# Patient Record
Sex: Male | Born: 1940 | Race: White | Hispanic: No
Health system: Southern US, Community
[De-identification: ages and names within clinical notes are randomized; demographics above are authoritative.]

## PROBLEM LIST (undated history)

## (undated) DIAGNOSIS — I739 Peripheral vascular disease, unspecified: Secondary | ICD-10-CM

## (undated) DIAGNOSIS — G2581 Restless legs syndrome: Secondary | ICD-10-CM

## (undated) DIAGNOSIS — C801 Malignant (primary) neoplasm, unspecified: Secondary | ICD-10-CM

## (undated) DIAGNOSIS — C679 Malignant neoplasm of bladder, unspecified: Secondary | ICD-10-CM

## (undated) DIAGNOSIS — R2689 Other abnormalities of gait and mobility: Secondary | ICD-10-CM

## (undated) DIAGNOSIS — J449 Chronic obstructive pulmonary disease, unspecified: Secondary | ICD-10-CM

## (undated) DIAGNOSIS — M1990 Unspecified osteoarthritis, unspecified site: Secondary | ICD-10-CM

## (undated) DIAGNOSIS — E785 Hyperlipidemia, unspecified: Secondary | ICD-10-CM

## (undated) DIAGNOSIS — C439 Malignant melanoma of skin, unspecified: Secondary | ICD-10-CM

## (undated) DIAGNOSIS — I8390 Asymptomatic varicose veins of unspecified lower extremity: Secondary | ICD-10-CM

## (undated) DIAGNOSIS — E7800 Pure hypercholesterolemia, unspecified: Secondary | ICD-10-CM

## (undated) DIAGNOSIS — H9190 Unspecified hearing loss, unspecified ear: Secondary | ICD-10-CM

## (undated) DIAGNOSIS — G4700 Insomnia, unspecified: Secondary | ICD-10-CM

## (undated) DIAGNOSIS — N189 Chronic kidney disease, unspecified: Secondary | ICD-10-CM

## (undated) DIAGNOSIS — I639 Cerebral infarction, unspecified: Secondary | ICD-10-CM

## (undated) DIAGNOSIS — D649 Anemia, unspecified: Secondary | ICD-10-CM

## (undated) DIAGNOSIS — K219 Gastro-esophageal reflux disease without esophagitis: Secondary | ICD-10-CM

## (undated) DIAGNOSIS — G459 Transient cerebral ischemic attack, unspecified: Secondary | ICD-10-CM

## (undated) DIAGNOSIS — I2510 Atherosclerotic heart disease of native coronary artery without angina pectoris: Secondary | ICD-10-CM

## (undated) DIAGNOSIS — C649 Malignant neoplasm of unspecified kidney, except renal pelvis: Secondary | ICD-10-CM

## (undated) DIAGNOSIS — I719 Aortic aneurysm of unspecified site, without rupture: Secondary | ICD-10-CM

## (undated) DIAGNOSIS — M170 Bilateral primary osteoarthritis of knee: Secondary | ICD-10-CM

## (undated) DIAGNOSIS — R609 Edema, unspecified: Secondary | ICD-10-CM

## (undated) DIAGNOSIS — M109 Gout, unspecified: Secondary | ICD-10-CM

## (undated) DIAGNOSIS — Q788 Other specified osteochondrodysplasias: Secondary | ICD-10-CM

## (undated) DIAGNOSIS — Z955 Presence of coronary angioplasty implant and graft: Secondary | ICD-10-CM

## (undated) DIAGNOSIS — I10 Essential (primary) hypertension: Secondary | ICD-10-CM

## (undated) DIAGNOSIS — M25551 Pain in right hip: Secondary | ICD-10-CM

## (undated) HISTORY — DX: Restless legs syndrome: G25.81

## (undated) HISTORY — DX: Cerebral infarction, unspecified: I63.9

## (undated) HISTORY — DX: Unspecified hearing loss, unspecified ear: H91.90

## (undated) HISTORY — DX: Bilateral primary osteoarthritis of knee: M17.0

## (undated) HISTORY — PX: CAROTID ENDARTERECTOMY: SUR193

## (undated) HISTORY — DX: Asymptomatic varicose veins of unspecified lower extremity: I83.90

## (undated) HISTORY — DX: Pain in right hip: M25.551

## (undated) HISTORY — DX: Malignant neoplasm of unspecified kidney, except renal pelvis: C64.9

## (undated) HISTORY — DX: Unspecified osteoarthritis, unspecified site: M19.90

## (undated) HISTORY — PX: KIDNEY SURGERY: SHX687

## (undated) HISTORY — DX: Other specified osteochondrodysplasias: Q78.8

## (undated) HISTORY — DX: Peripheral vascular disease, unspecified: I73.9

## (undated) HISTORY — PX: BLADDER SURGERY: SHX569

## (undated) HISTORY — PX: CORONARY ANGIOPLASTY WITH STENT PLACEMENT: SHX49

## (undated) HISTORY — PX: ABDOMINAL AORTIC ANEURYSM REPAIR: SUR1152

## (undated) HISTORY — DX: Presence of coronary angioplasty implant and graft: Z95.5

## (undated) HISTORY — PX: TONSILLECTOMY: SUR1361

## (undated) HISTORY — DX: Other abnormalities of gait and mobility: R26.89

## (undated) HISTORY — DX: Malignant (primary) neoplasm, unspecified: C80.1

## (undated) HISTORY — DX: Malignant neoplasm of bladder, unspecified: C67.9

## (undated) HISTORY — DX: Insomnia, unspecified: G47.00

## (undated) HISTORY — DX: Malignant melanoma of skin, unspecified: C43.9

## (undated) HISTORY — PX: JOINT REPLACEMENT: SHX530

## (undated) HISTORY — DX: Gout, unspecified: M10.9

## (undated) HISTORY — DX: Essential (primary) hypertension: I10

## (undated) HISTORY — DX: Pure hypercholesterolemia, unspecified: E78.00

## (undated) HISTORY — DX: Transient cerebral ischemic attack, unspecified: G45.9

## (undated) HISTORY — PX: NEPHRECTOMY: SHX65

## (undated) HISTORY — DX: Hyperlipidemia, unspecified: E78.5

## (undated) HISTORY — DX: Aortic aneurysm of unspecified site, without rupture: I71.9

## (undated) HISTORY — PX: ARTERIAL THROMBECTOMY: SHX558

## (undated) HISTORY — DX: Anemia, unspecified: D64.9

## (undated) HISTORY — PX: OTHER SURGICAL HISTORY: SHX169

## (undated) HISTORY — DX: Chronic kidney disease, unspecified: N18.9

## (undated) HISTORY — DX: Atherosclerotic heart disease of native coronary artery without angina pectoris: I25.10

## (undated) HISTORY — DX: Chronic obstructive pulmonary disease, unspecified: J44.9

## (undated) HISTORY — DX: Edema, unspecified: R60.9

## (undated) HISTORY — DX: Gastro-esophageal reflux disease without esophagitis: K21.9

---

## 2012-11-07 HISTORY — PX: OTHER SURGICAL HISTORY: SHX169

## 2012-11-14 ENCOUNTER — Non-Acute Institutional Stay (SKILLED_NURSING_FACILITY): Payer: Medicare Other | Admitting: Adult Health

## 2012-11-14 ENCOUNTER — Encounter: Payer: Self-pay | Admitting: Adult Health

## 2012-11-14 DIAGNOSIS — E785 Hyperlipidemia, unspecified: Secondary | ICD-10-CM | POA: Insufficient documentation

## 2012-11-14 DIAGNOSIS — K219 Gastro-esophageal reflux disease without esophagitis: Secondary | ICD-10-CM | POA: Insufficient documentation

## 2012-11-14 DIAGNOSIS — M1710 Unilateral primary osteoarthritis, unspecified knee: Secondary | ICD-10-CM

## 2012-11-14 DIAGNOSIS — M1712 Unilateral primary osteoarthritis, left knee: Secondary | ICD-10-CM

## 2012-11-14 DIAGNOSIS — R609 Edema, unspecified: Secondary | ICD-10-CM

## 2012-11-14 DIAGNOSIS — I10 Essential (primary) hypertension: Secondary | ICD-10-CM

## 2012-11-14 DIAGNOSIS — G2581 Restless legs syndrome: Secondary | ICD-10-CM | POA: Insufficient documentation

## 2012-11-14 NOTE — Assessment & Plan Note (Signed)
Will continue prilosec 20 mg daily  

## 2012-11-14 NOTE — Assessment & Plan Note (Signed)
Will continue coreg 6.25 mg twice daily and will monitor

## 2012-11-14 NOTE — Assessment & Plan Note (Signed)
Is stable will continue requip 2 mg nightly and will monitor

## 2012-11-14 NOTE — Assessment & Plan Note (Signed)
Will continue lasix 20 mg twice daily and will monitor

## 2012-11-14 NOTE — Progress Notes (Signed)
Patient ID: Zachary Sloan, male   DOB: [birth year], 72 y.o.   MRN: [id number]  [facility]  Allergies  Allergen Reactions  . Ambien (Zolpidem Tartrate)   . Norvasc (Amlodipine Besylate)      Chief Complaint  Patient presents with  . Hospitalization Follow-up    HPI:  Osteoarthritis of left knee Is status post left knee replacement the left knee is hot; red; inflamed swollen with the erythema extending to mid calf; will continue doxycycline; will have him seen at dr. Paul Sloan office and in the am and will continue to monitor his status   Restless leg syndrome Is stable will continue requip 2 mg nightly and will monitor  Other and unspecified hyperlipidemia Will continue lipitor 80 mg daily   Essential hypertension, benign Will continue coreg 6.25 mg twice daily and will monitor  Edema Will continue lasix 20 mg twice daily and will monitor   GERD (gastroesophageal reflux disease) Will continue prilosec 20 mg daily    Past Medical History  Diagnosis Date  . Arthritis   . GERD (gastroesophageal reflux disease)   . Hyperlipidemia   . COPD (chronic obstructive pulmonary disease)   . CAD (coronary artery disease)   . Transient ischemic attack   . Renal cancer   . Bladder cancer   . Melanoma   . Anemia     Past Surgical History  Procedure Laterality Date  . Left total knee arthroplasty  [id number]    VITAL SIGNS BP 132/75  Pulse 96  Ht 5\' 11"  (1.803 m)  Wt 148 lb (67.132 kg)  BMI 20.65 kg/m2   Patient's Medications  New Prescriptions   No medications on file  Previous Medications   ATORVASTATIN (LIPITOR) 80 MG TABLET    Take 80 mg by mouth daily.   CARVEDILOL (COREG) 6.25 MG TABLET    Take 6.25 mg by mouth 2 (two) times daily with a meal.   CHOLECALCIFEROL (VITAMIN D) 1000 UNITS TABLET    Take 2,000 Units by mouth daily.   DIPHENHYDRAMINE (SOMINEX) 25 MG TABLET    Take 50 mg by mouth at bedtime as needed for sleep.   DOXYCYCLINE (VIBRAMYCIN) 100 MG CAPSULE     Take 100 mg by mouth 2 (two) times daily.   FUROSEMIDE (LASIX) 20 MG TABLET    Take 20 mg by mouth 2 (two) times daily.   OMEPRAZOLE (PRILOSEC) 20 MG CAPSULE    Take 20 mg by mouth daily.   OXYCODONE-ACETAMINOPHEN (PERCOCET/ROXICET) 5-325 MG PER TABLET    Take 1-2 tablets by mouth every 6 (six) hours as needed for pain.   ROPINIROLE (REQUIP) 2 MG TABLET    Take 2 mg by mouth at bedtime.   VALACYCLOVIR (VALTREX) 1000 MG TABLET    Take 1,000 mg by mouth daily as needed.   WARFARIN (COUMADIN) 2.5 MG TABLET    Take 2.5 mg by mouth daily.  Modified Medications   No medications on file  Discontinued Medications   No medications on file    SIGNIFICANT DIAGNOSTIC EXAMS  11-10-12: chest x-ray: no acute disease  LABS REVIEWED:   11-10-12:wbc 13.2; hgb 10.3; hct 31.3; mcv 94.3.; plt 207 glucose 115; bun 22; creat 1.54; k+ 3.6;  na++ 141   Review of Systems  Constitutional: Negative for fever and malaise/fatigue.  Respiratory: Negative for cough and shortness of breath.   Cardiovascular: Negative for chest pain and leg swelling.  Gastrointestinal: Negative for heartburn, abdominal pain and constipation.  Musculoskeletal: Negative for myalgias and joint  pain.  Skin:       Left knee is red hot swollen and painful  Neurological: Negative for headaches.  Psychiatric/Behavioral: Negative for depression. The patient does not have insomnia.      Physical Exam  Constitutional: He is oriented to person, place, and time. He appears well-developed and well-nourished.  thin  Neck: Neck supple. No thyromegaly present.  Cardiovascular: Normal rate, regular rhythm and intact distal pulses.   Respiratory: Effort normal and breath sounds normal. No respiratory distress.  GI: Soft. Bowel sounds are normal. He exhibits no distension. There is no tenderness.  Musculoskeletal: He exhibits no edema.  Has limited range of motion left knee due to recent joint replacement  Neurological: He is alert and  oriented to person, place, and time.  Skin: Skin is warm and dry.  Left knee erythremic; hot; swollen; tender to touch; with redness extending to mid calf.    Psychiatric: He has a normal mood and affect.      ASSESSMENT/ PLAN:  Osteoarthritis of left knee Is status post left knee replacement the left knee is hot; red; inflamed swollen with the erythema extending to mid calf; will continue doxycycline; will have him seen at dr. Paul Sloan office and in the am and will continue to monitor his status   Restless leg syndrome Is stable will continue requip 2 mg nightly and will monitor  Other and unspecified hyperlipidemia Will continue lipitor 80 mg daily   Essential hypertension, benign Will continue coreg 6.25 mg twice daily and will monitor  Edema Will continue lasix 20 mg twice daily and will monitor   GERD (gastroesophageal reflux disease) Will continue prilosec 20 mg daily    Will get stat cbc; inr in the am.   Time spent with patient 50 minutes

## 2012-11-14 NOTE — Assessment & Plan Note (Signed)
Is status post left knee replacement the left knee is hot; red; inflamed swollen with the erythema extending to mid calf; will continue doxycycline; will have him seen at dr. [name] office and in the am and will continue to monitor his status

## 2012-11-14 NOTE — Assessment & Plan Note (Signed)
Will continue lipitor 80 mg daily  

## 2012-11-15 ENCOUNTER — Other Ambulatory Visit: Payer: Self-pay | Admitting: *Deleted

## 2012-11-15 ENCOUNTER — Non-Acute Institutional Stay (SKILLED_NURSING_FACILITY): Payer: Medicare Other | Admitting: Internal Medicine

## 2012-11-15 DIAGNOSIS — M1712 Unilateral primary osteoarthritis, left knee: Secondary | ICD-10-CM

## 2012-11-15 DIAGNOSIS — M1710 Unilateral primary osteoarthritis, unspecified knee: Secondary | ICD-10-CM

## 2012-11-15 DIAGNOSIS — I80299 Phlebitis and thrombophlebitis of other deep vessels of unspecified lower extremity: Secondary | ICD-10-CM

## 2012-11-15 DIAGNOSIS — J449 Chronic obstructive pulmonary disease, unspecified: Secondary | ICD-10-CM

## 2012-11-15 DIAGNOSIS — D62 Acute posthemorrhagic anemia: Secondary | ICD-10-CM

## 2012-11-15 MED ORDER — OXYCODONE-ACETAMINOPHEN 5-325 MG PO TABS: ORAL_TABLET | ORAL | Status: DC

## 2012-11-22 ENCOUNTER — Non-Acute Institutional Stay (SKILLED_NURSING_FACILITY): Payer: Medicare Other | Admitting: Internal Medicine

## 2012-11-22 DIAGNOSIS — I82402 Acute embolism and thrombosis of unspecified deep veins of left lower extremity: Secondary | ICD-10-CM

## 2012-11-22 DIAGNOSIS — I82409 Acute embolism and thrombosis of unspecified deep veins of unspecified lower extremity: Secondary | ICD-10-CM

## 2012-11-22 NOTE — Progress Notes (Signed)
Patient ID: Zachary Sloan, male   DOB: 10-02-1940, 72 y.o.   MRN: [id number] Facility; [facility]. Chief complaint predischarge review. History; this is a patient who underwent a elective total knee replacement on the left. He was sent here for rehabilitation on Coumadin for DVT prophylaxis. He is rehabilitating well and is now ready to go home. A week ago. He was noted to have left leg swelling and had a duplex ultrasound of the left leg showing an acute DVT in the left posterior tibial vein and soleal vein. His INR at that time was in the 1.9-2 range. He has no history of DVT. He was seen by our service on 6/24 and sent to interventional radiology for consideration of a Greenfield filter, although I am not certain that was done, he did not have a Greenfield filter placed. His INR yesterday was 2.2. He is due to go home today. Prior to the hospitalization. He tells me was on plavix and asa 81 since 2002 due to 2 TIA's he suffered at that time. He did not receive lovenox last week. He is currently on coumadin 3 mg  ROS: Genral: tells me he has been running low grade temps Respiratory; No sob, chest pain CVS; No exertional chest pain MSK; Able to ambulate with a cane.  Physical exam.  General: appears well REsp: clear a/e bilaterally no crackles or wheezing CVS: HS normal no S4 no murmers.  EST: Still swelling of Left leg, erythema of the left knee seems to have receeded vs previous markings.   Impressions/plan #1 DVT left leg. His INR is 1.9 which seems to be the level at which the DVT formed in the first place. I think he should have received lovenox last week until his INR was in the 2.5-3 range. I am  Concerned about him going home at this poiint and will discuss this with the patient #2 On plavix and TIA for a history of a TIA (?2) in 2002, this need to be put on hold indefinately until seen by his primary MD Or cardiologist.   After further discussion we agree that we should bridge  lovenox to coumadin. He  Lives alone. I am not comfortable with the home health option as it applies to this type of intervention. I will also check a CBC. If his inr is up on firday to 2.5-3.0 i will dicharge him on coumadin.

## 2012-11-25 ENCOUNTER — Non-Acute Institutional Stay (SKILLED_NURSING_FACILITY): Payer: Medicare Other | Admitting: Internal Medicine

## 2012-11-25 DIAGNOSIS — R609 Edema, unspecified: Secondary | ICD-10-CM

## 2012-11-25 DIAGNOSIS — I82409 Acute embolism and thrombosis of unspecified deep veins of unspecified lower extremity: Secondary | ICD-10-CM

## 2012-11-25 DIAGNOSIS — I82402 Acute embolism and thrombosis of unspecified deep veins of left lower extremity: Secondary | ICD-10-CM

## 2012-11-25 DIAGNOSIS — M109 Gout, unspecified: Secondary | ICD-10-CM

## 2012-11-25 NOTE — Progress Notes (Signed)
Patient ID: Zachary Sloan, male   DOB: [birth year], 72 y.o.   MRN: [id number]              Patient ID: Zachary Sloan, male   DOB: [birth year], 72 y.o.   MRN: [id number] Facility; [facility]. Chief complaint predischarge review. History; this is a patient who underwent a elective total knee replacement on the left. I saw him 3 days ago and placed his discharge on hold due to inadequte anticoagulation for an acute DVT of his left leg. I have bridged him with full dose lovenox while increasing his coumadin.  He was sent here for rehabilitation on Coumadin for DVT prophylaxis however he will now require a full course of coumadin for ?3 -4 months. A week ago. He was noted to have left leg swelling and had a duplex ultrasound of the left leg showing an acute DVT in the left posterior tibial vein and soleal vein. His INR at that time was in the 1.9-2 range. He has no history of DVT. He was seen by our service on 6/24 and sent to interventional radiology for consideration of a Greenfield filter. This was fortunately not done. I have treated him with full dose lovenox. On 3mg  of coumadin on 7/1 his INR was 1.9. On 5mg  yesterday it was2.4, today on 5mg  2.6. I will discharge him on 4mg .   ROS: General: feels well no fever. Respiratory; No sob, chest pain, no pleuritic chest pain. CVS; No exertional chest pain MSK; Able to ambulate with a cane. Pain is quite tolerable. Swelling in his leg is much better. Has developed acute pain in the lefft first MTP. Had an attack of gout several years ago.  Physical exam.   General: appears well REsp: clear a/e bilaterally no crackles or wheezing CVS: HS normal no S4, soft benign sounding SEM. No chf.    ExT: Swelling of the left leg is much imporved. Still some minor erythema of his left knee laterally however I don't believe this is indicative of an infection.  MSK; Left leg is much better in terms of swelling. Minimal erythema of his knee. Acute arthritis in his first MTP  on the left which is probably gout.   Impressions/plan #1 DVT left leg. His INR is 2.6 on 5mg . Lovenox will stop. I will give him 5mg  x1 today then discharge on 4mg . F/u with his primary md in 1wk #2 On plavix and TIA for a history of a TIA (?2) in 2002, this need to be put on hold indefinately until seen by his primary MD Or cardiologist. I am not prepated to give these in addition to coumadin.  3)S/p left totatl knee replacement. Doing well, home health Pt/OT/SN. Has a walker and cane.  4)Acute gout: Rx with colchicine. In full dose today. Will see if we have in house.  5)CRf this is stable 6)anemia presumable post op. Will dischage on FE  >30 min spent.

## 2012-12-11 NOTE — Progress Notes (Signed)
Patient ID: Zachary Sloan, male   DOB: November 01, 1940, 72 y.o.   MRN: [id number]        HISTORY & PHYSICAL  DATE: 11/15/2012   FACILITY: [facility]  LEVEL OF CARE: SNF (31)  ALLERGIES:  Allergies  Allergen Reactions  . Ambien (Zolpidem Tartrate)   . Norvasc (Amlodipine Besylate)     CHIEF COMPLAINT:  Manage left knee osteoarthritis, acute blood loss anemia, and COPD.  HISTORY OF PRESENT ILLNESS:  The patient is a 72 year-old, Caucasian male.    KNEE OSTEOARTHRITIS: Patient had a history of pain and functional disability in the knee due to end-stage osteoarthritis and has failed nonsurgical conservative treatments. Patient had worsening of pain with activity and weight bearing, pain that interfered with activities of daily living & pain with passive range of motion. Therefore patient underwent total knee arthroplasty and tolerated the procedure well. Patient is admitted to this facility for sort short-term rehabilitation. The patient is having increased left lower extremity swelling and pain.  A doppler was done by Orthopedic Surgery which showed a positive DVT.  He denies chest pain, shortness of breath.    ANEMIA: Postoperatively, patient suffered acute blood loss.  The anemia has been stable. The patient denies fatigue, melena or hematochezia. No complications from the medications currently being used.  Last hemoglobin level:  11.8.  COPD: the COPD remains stable.  Pt denies sob, cough, wheezing or declining exercise tolerance.  No complications from the medications presently being used.    PAST MEDICAL HISTORY :  Past Medical History  Diagnosis Date  . Arthritis   . GERD (gastroesophageal reflux disease)   . Hyperlipidemia   . COPD (chronic obstructive pulmonary disease)   . CAD (coronary artery disease)   . Transient ischemic attack   . Renal cancer   . Bladder cancer   . Melanoma   . Anemia    Left knee osteoarthritis.  PAST SURGICAL HISTORY: Past  Surgical History  Procedure Laterality Date  . Left total knee arthroplasty  [id number]    SOCIAL HISTORY: TOBACCO USE:  The patient was a former smoker, had quit.   ALCOHOL:  He drinks about two glasses of alcohol nightly.   ILLICIT DRUGS:  He denies illicit drug use.   EMPLOYMENT HISTORY:  He is retired from multiple jobs, largely Airline pilot.    FAMILY HISTORY:  Family History  Problem Relation Age of Onset  . Family history unknown: Yes    CURRENT MEDICATIONS: Reviewed per MAR  REVIEW OF SYSTEMS:  See HPI otherwise 14 point ROS is negative.  PHYSICAL EXAMINATION  VS:  T 99       P 82      RR 18      BP 114/66      POX%        WT (Lb)  GENERAL: no acute distress, normal body habitus SKIN: warm & dry, no suspicious lesions or rashes, no excessive dryness, left knee incision clean and dry, staples are in place  EYES: conjunctivae normal, sclerae normal, normal eye lids MOUTH/THROAT: lips without lesions,no lesions in the mouth,tongue is without lesions,uvula elevates in midline NECK: supple, trachea midline, no neck masses, no thyroid tenderness, no thyromegaly LYMPHATICS: no LAN in the neck, no supraclavicular LAN RESPIRATORY: breathing is even & unlabored, BS CTAB CARDIAC: RRR, no murmur,no extra heart sounds EDEMA/VARICOSITIES: left lower extremity has +2 edema ARTERIAL:  pedal pulses nonpalpable  GI:  ABDOMEN: abdomen soft, normal BS, no masses, no  tenderness  LIVER/SPLEEN: no hepatomegaly, no splenomegaly MUSCULOSKELETAL: HEAD: normal to inspection & palpation BACK: no kyphosis, scoliosis or spinal processes tenderness EXTREMITIES: LEFT UPPER EXTREMITY: full range of motion, normal strength & tone RIGHT UPPER EXTREMITY:  full range of motion, normal strength & tone LEFT LOWER EXTREMITY:  full range of motion, normal strength & tone RIGHT LOWER EXTREMITY:  full range of motion, normal strength & tone PSYCHIATRIC: the patient is alert & oriented to person, affect & behavior  appropriate  LABS/RADIOLOGY: INR today is 1.9.  Urinalysis negative.    Chest x-ray:  No acute disease.    Glucose 115, creatinine 1.54, otherwise BMP normal.    Hemoglobin 10.3, MCV 94.3, platelets 207.   MRSA by PCR negative.     ASSESSMENT/PLAN:  Left knee osteoarthritis.  Status post left total knee arthroplasty.  Continue rehabilitation.   Left lower extremity DVT.  New onset.  Significant problem.  The patient's INR has been therapeutic.  Therefore, the DVT occurred on therapeutic INR.  Discussed IVC filter placement and patient agreed.  Therefore, obtain an Interventional Radiology consultation.    COPD.  Well compensated.    V58.61.  Increase Coumadin to 3 mg q.d.  Goal INR 2.5 to 3.5 as the DVT occurred on therapeutic INR.    Acute blood loss anemia.  Reassess.   Coronary artery disease.  Stable.    GERD.  Well controlled.     Check CBC and CMP.    I have reviewed patient's medical records received at admission/from hospitalization.  CPT CODE: 21308

## 2012-12-12 DIAGNOSIS — J4489 Other specified chronic obstructive pulmonary disease: Secondary | ICD-10-CM | POA: Insufficient documentation

## 2012-12-12 DIAGNOSIS — I80299 Phlebitis and thrombophlebitis of other deep vessels of unspecified lower extremity: Secondary | ICD-10-CM | POA: Insufficient documentation

## 2012-12-12 DIAGNOSIS — D62 Acute posthemorrhagic anemia: Secondary | ICD-10-CM | POA: Insufficient documentation

## 2012-12-12 DIAGNOSIS — J449 Chronic obstructive pulmonary disease, unspecified: Secondary | ICD-10-CM | POA: Insufficient documentation

## 2016-09-22 DEATH — deceased

## 2018-06-20 ENCOUNTER — Encounter: Payer: Self-pay | Admitting: Internal Medicine

## 2018-06-20 ENCOUNTER — Non-Acute Institutional Stay (SKILLED_NURSING_FACILITY): Payer: Medicare Other | Admitting: Internal Medicine

## 2018-06-20 DIAGNOSIS — M1A9XX0 Chronic gout, unspecified, without tophus (tophi): Secondary | ICD-10-CM | POA: Diagnosis not present

## 2018-06-20 DIAGNOSIS — I10 Essential (primary) hypertension: Secondary | ICD-10-CM | POA: Diagnosis not present

## 2018-06-20 DIAGNOSIS — Z9861 Coronary angioplasty status: Secondary | ICD-10-CM | POA: Diagnosis not present

## 2018-06-20 DIAGNOSIS — G2581 Restless legs syndrome: Secondary | ICD-10-CM

## 2018-06-20 DIAGNOSIS — M1712 Unilateral primary osteoarthritis, left knee: Secondary | ICD-10-CM

## 2018-06-20 DIAGNOSIS — F32A Depression, unspecified: Secondary | ICD-10-CM

## 2018-06-20 DIAGNOSIS — I739 Peripheral vascular disease, unspecified: Secondary | ICD-10-CM

## 2018-06-20 DIAGNOSIS — I2510 Atherosclerotic heart disease of native coronary artery without angina pectoris: Secondary | ICD-10-CM

## 2018-06-20 DIAGNOSIS — E785 Hyperlipidemia, unspecified: Secondary | ICD-10-CM

## 2018-06-20 DIAGNOSIS — F329 Major depressive disorder, single episode, unspecified: Secondary | ICD-10-CM

## 2018-06-20 NOTE — Progress Notes (Signed)
:  Location:  [facility] Room Number: 100P Place of Service:  SNF (31)  Zachary Sloan [name] Coil, MD  Patient Care Team: Zachary Duos, MD as PCP - General (Internal Medicine)  Extended Emergency Contact Information Primary Emergency Contact: No,Contact  [place] Phone: [phone] Relation: Other     Allergies: Ambien [zolpidem tartrate]; Amlodipine; Norvasc [amlodipine besylate]; and Tramadol  Chief Complaint  Patient presents with  . New Admit To SNF    Admit to [facility]    HPI: Patient is 78 y.o. male with hypertension, GERD, restless leg syndrome, gout, history of stroke, hyperlipidemia, who was admitted to [facility] from 1/20 2-25 for planned right total knee arthroplasty per Dr. Len Sloan.  Patient was started on ASA 325 mg twice daily compression hose for DVT prophylaxis.  Patient is admitted to skilled nursing facility for OT/PT.  While at skilled nursing facility patient will be followed for hypertension treated with Coreg lisinopril and Lasix, gout treated with Uloric and colchicine and coronary artery disease treated with Coreg Plavix.  Past Medical History:  Diagnosis Date  . Anemia   . Aortic aneurysm ([place])   . Arthritis   . Balance problem   . Bladder cancer ([place])   . CAD (coronary artery disease)   . Cancer ([place])   . Chronic kidney disease   . COPD (chronic obstructive pulmonary disease) ([place])   . Edema   . GERD (gastroesophageal reflux disease)   . Gout   . Hearing loss   . History of heart artery stent   . Hypercholesteremia   . Hyperlipidemia   . Hypertension   . Insomnia   . Melanoma ([place])   . Osteoarthritis of both knees   . Osteopoikilosis   . Peripheral vascular disease ([place])   . Renal cancer ([place])   . Right hip pain   . RLS (restless legs syndrome)   . Stroke ([place])   . Transient ischemic attack   . Varicose vein of leg     Past Surgical History:  Procedure Laterality  Date  . ABDOMINAL AORTIC ANEURYSM REPAIR    . ARTERIAL THROMBECTOMY    . BLADDER SURGERY    . CAROTID ENDARTERECTOMY    . CORONARY ANGIOPLASTY WITH STENT PLACEMENT    . JOINT REPLACEMENT    . KIDNEY SURGERY    . knee surgery    . left total knee arthroplasty  [id number]  . NEPHRECTOMY    . TONSILLECTOMY      Allergies as of 06/20/2018      Reactions   Ambien [zolpidem Tartrate]    Amlodipine    Norvasc [amlodipine Besylate]    Tramadol       Medication List       Accurate as of June 20, 2018 11:28 AM. Always use your most recent med list.        albuterol 108 (90 Base) MCG/ACT inhaler Commonly known as:  PROVENTIL HFA;VENTOLIN HFA Inhale into the lungs every 6 (six) hours as needed for wheezing or shortness of breath.   aspirin 325 MG EC tablet Take 325 mg by mouth 2 (two) times daily.   atorvastatin 80 MG tablet Commonly known as:  LIPITOR Take 80 mg by mouth daily.   budesonide-formoterol 160-4.5 MCG/ACT inhaler Commonly known as:  SYMBICORT Inhale 2 puffs into the lungs 2 (two) times daily.   carvedilol 25 MG tablet Commonly known as:  COREG Take 25 mg by  mouth. Take 0.5 tablets by mouth 2 times daily.   CENTRUM SILVER PO Take 1 tablet by mouth daily.   cholecalciferol 1000 units tablet Commonly known as:  VITAMIN D Take 2,000 Units by mouth daily.   clopidogrel 75 MG tablet Commonly known as:  PLAVIX Take 75 mg by mouth daily.   colchicine 0.6 MG tablet Take 0.6 mg by mouth daily.   doxylamine (Sleep) 25 MG tablet Commonly known as:  UNISOM Take 25 mg by mouth at bedtime as needed.   DULoxetine 30 MG capsule Commonly known as:  CYMBALTA Take 30 mg by mouth daily.   febuxostat 40 MG tablet Commonly known as:  ULORIC Take 40 mg by mouth daily. As needed   ferrous sulfate 325 (65 FE) MG tablet Take 325 mg by mouth daily with breakfast.   furosemide 20 MG tablet Commonly known as:  LASIX Take 20 mg by mouth 2 (two) times daily.     lisinopril 10 MG tablet Commonly known as:  PRINIVIL,ZESTRIL Take 10 mg by mouth daily.   omeprazole 20 MG capsule Commonly known as:  PRILOSEC Take 20 mg by mouth daily.   oxyCODONE-acetaminophen 5-325 MG tablet Commonly known as:  PERCOCET/ROXICET Take one tablet by mouth every 4-6 hours as needed for pain; Take two tablets by mouth every 4-6 hours as needed for pain   rOPINIRole 2 MG tablet Commonly known as:  REQUIP Take 3 mg by mouth at bedtime.   valACYclovir 1000 MG tablet Commonly known as:  VALTREX Take 1,000 mg by mouth daily as needed.   zolpidem 5 MG tablet Commonly known as:  AMBIEN Take 5 mg by mouth at bedtime as needed for sleep.       No orders of the defined types were placed in this encounter.    There is no immunization history on file for this patient.  Social History   Tobacco Use  . Smoking status: Former Smoker    Types: Cigarettes  . Smokeless tobacco: Never Used  Substance Use Topics  . Alcohol use: Yes    Comment: 8.4 oz / week    Family history is   Family History  Problem Relation Age of Onset  . Coronary artery disease Father   . Heart disease Father   . Cancer Brother       Review of Systems  DATA OBTAINED: from patient, nurse GENERAL:  no fevers, fatigue, appetite changes SKIN: No itching, or rash EYES: No eye pain, redness, discharge EARS: No earache, tinnitus, change in hearing NOSE: No congestion, drainage or bleeding  MOUTH/THROAT: No mouth or tooth pain, No sore throat RESPIRATORY: No cough, wheezing, SOB CARDIAC: No chest pain, palpitations, lower extremity edema  GI: No abdominal pain, No N/V/D or constipation, No heartburn or reflux  GU: No dysuria, frequency or urgency, or incontinence  MUSCULOSKELETAL: No unrelieved bone/joint pain NEUROLOGIC: No headache, dizziness or focal weakness PSYCHIATRIC: No c/o anxiety or sadness   Vitals:   06/20/18 1053  BP: 136/79  Pulse: 78  Resp: 18  Temp: 98.4 F  (36.9 C)    SpO2 Readings from Last 1 Encounters:  No data found for SpO2   Body mass index is 23.18 kg/m.     Physical Exam  GENERAL APPEARANCE: Alert, conversant,  No acute distress.  SKIN: No diaphoresis rash HEAD: Normocephalic, atraumatic  EYES: Conjunctiva/lids clear. Pupils round, reactive. EOMs intact.  EARS: External exam WNL, canals clear. Hearing grossly normal.  NOSE: No deformity or discharge.  MOUTH/THROAT: Lips w/o lesions  RESPIRATORY: Breathing is even, unlabored. Lung sounds are clear   CARDIOVASCULAR: Heart RRR no murmurs, rubs or gallops.  Trace peripheral edema.  Right lower extremity expected status post surgery GASTROINTESTINAL: Abdomen is soft, non-tender, not distended w/ normal bowel sounds. GENITOURINARY: Bladder non tender, not distended  MUSCULOSKELETAL: No abnormal joints or musculature NEUROLOGIC:  Cranial nerves 2-12 grossly intact. Moves all extremities  PSYCHIATRIC: Mood and affect appropriate to situation, no behavioral issues  Patient Active Problem List   Diagnosis Date Noted  . Acute posthemorrhagic anemia 12/12/2012  . Chronic airway obstruction, not elsewhere classified 12/12/2012  . Phlebitis and thrombophlebitis of other deep vessels of lower extremities 12/12/2012  . Osteoarthritis of left knee 11/14/2012  . Restless leg syndrome 11/14/2012  . Other and unspecified hyperlipidemia 11/14/2012  . Essential hypertension, benign 11/14/2012  . Edema 11/14/2012  . GERD (gastroesophageal reflux disease) 11/14/2012      Labs reviewed: Basic Metabolic Panel: No results found for: NA, K, CL, CO2, GLUCOSE, BUN, CREATININE, CALCIUM, PROT, ALBUMIN, AST, ALT, ALKPHOS, BILITOT, GFRNONAA, GFRAA  No results for input(s): NA, K, CL, CO2, GLUCOSE, BUN, CREATININE, CALCIUM, MG, PHOS in the last 8760 hours. Liver Function Tests: No results for input(s): AST, ALT, ALKPHOS, BILITOT, PROT, ALBUMIN in the last 8760 hours. No results for input(s):  LIPASE, AMYLASE in the last 8760 hours. No results for input(s): AMMONIA in the last 8760 hours. CBC: No results for input(s): WBC, NEUTROABS, HGB, HCT, MCV, PLT in the last 8760 hours. Lipid No results for input(s): CHOL, HDL, LDLCALC, TRIG in the last 8760 hours.  Cardiac Enzymes: No results for input(s): CKTOTAL, CKMB, CKMBINDEX, TROPONINI in the last 8760 hours. BNP: No results for input(s): BNP in the last 8760 hours. No results found for: MICROALBUR No results found for: HGBA1C No results found for: TSH No results found for: VITAMINB12 No results found for: FOLATE No results found for: IRON, TIBC, FERRITIN  Imaging and Procedures obtained prior to SNF admission: Patient was never admitted.   Not all labs, radiology exams or other studies done during hospitalization come through on my EPIC note; however they are reviewed by me.    Assessment and Plan  Osteoarthritis right knee/status post right knee arthroplasty- no apparent complications; patient on 325 mg aspirin twice daily and compression stockings for 3 weeks for DVT prophylaxis SNF- admitted for OT/PT; will continue ASA 325 mg twice daily and compression stockings for 3 weeks and for DVT prophylaxis; will follow-up CBC  Hypertension SNF- controlled; continue Coreg 12.5 mg twice daily, lisinopril 10 mg daily and Lasix 20 mg every other day  Gout SNF- controlled at this time; continue Uloric 40 mg daily as needed and colchicine 0.6 mg daily as needed  Coronary artery disease/status post stent/peripheral vascular disease SNF no chest pain or equivalent; continue Coreg 12.5 mg twice daily Plavix 75 mg daily and ASA 81 mg daily to start after aspirin prophylaxis is over; patient is on statin  Restless leg syndrome SNF- continue Requip 3 mg nightly  Hyperlipidemia SNF- not stated as uncontrolled; continue Lipitor 80 mg daily  Depression SNF- continue Cymbalta 30 mg daily   Time spent greater than 45 minutes;>  50% of time with patient was spent reviewing records, labs, tests and studies, counseling and developing plan of care  Webb Silversmith [name] Coil, MD

## 2018-06-21 ENCOUNTER — Encounter: Payer: Self-pay | Admitting: Internal Medicine

## 2018-06-21 DIAGNOSIS — M109 Gout, unspecified: Secondary | ICD-10-CM | POA: Insufficient documentation

## 2018-06-21 DIAGNOSIS — F32A Depression, unspecified: Secondary | ICD-10-CM | POA: Insufficient documentation

## 2018-06-21 DIAGNOSIS — Z9861 Coronary angioplasty status: Secondary | ICD-10-CM

## 2018-06-21 DIAGNOSIS — I2510 Atherosclerotic heart disease of native coronary artery without angina pectoris: Secondary | ICD-10-CM | POA: Insufficient documentation

## 2018-06-21 DIAGNOSIS — F329 Major depressive disorder, single episode, unspecified: Secondary | ICD-10-CM | POA: Insufficient documentation

## 2018-06-21 DIAGNOSIS — I739 Peripheral vascular disease, unspecified: Secondary | ICD-10-CM | POA: Insufficient documentation

## 2018-06-21 MED ORDER — OXYCODONE-ACETAMINOPHEN 5-325 MG PO TABS: 1.0000 | ORAL_TABLET | Freq: Four times a day (QID) | ORAL | 0 refills | Status: DC | PRN

## 2018-06-21 NOTE — Addendum Note (Signed)
Addended by: Inocencio Homes D on: 06/21/2018 04:50 PM   Modules accepted: Orders

## 2018-06-22 ENCOUNTER — Non-Acute Institutional Stay (SKILLED_NURSING_FACILITY): Payer: Medicare Other | Admitting: Internal Medicine

## 2018-06-22 ENCOUNTER — Encounter: Payer: Self-pay | Admitting: Internal Medicine

## 2018-06-22 DIAGNOSIS — M7989 Other specified soft tissue disorders: Secondary | ICD-10-CM

## 2018-06-22 DIAGNOSIS — M79604 Pain in right leg: Secondary | ICD-10-CM | POA: Diagnosis not present

## 2018-06-22 NOTE — Progress Notes (Signed)
:  Location:  [facility] Room Number: 100P Place of Service:  SNF (31)  Sorren Vallier [name] Coil, MD  Patient Care Team: Hennie Duos, MD as PCP - General (Internal Medicine)  Extended Emergency Contact Information Primary Emergency Contact: No,Contact  [place] Phone: [phone] Relation: Other     Allergies: Ambien [zolpidem tartrate]; Amlodipine; Norvasc [amlodipine besylate]; and Tramadol  Chief Complaint  Patient presents with  . Acute Visit    HPI: Patient is 78 y.o. male who who is being seen because he has swelling to his right leg with pain onset today.  On examination right leg is much much larger than the left leg including thigh.  Patient says is worse when he moves it.  Past Medical History:  Diagnosis Date  . Anemia   . Aortic aneurysm ([place])   . Arthritis   . Balance problem   . Bladder cancer ([place])   . CAD (coronary artery disease)   . Cancer ([place])   . Chronic kidney disease   . COPD (chronic obstructive pulmonary disease) ([place])   . Edema   . GERD (gastroesophageal reflux disease)   . Gout   . Hearing loss   . History of heart artery stent   . Hypercholesteremia   . Hyperlipidemia   . Hypertension   . Insomnia   . Melanoma ([place])   . Osteoarthritis of both knees   . Osteopoikilosis   . Peripheral vascular disease ([place])   . Renal cancer ([place])   . Right hip pain   . RLS (restless legs syndrome)   . Stroke ([place])   . Transient ischemic attack   . Varicose vein of leg     Past Surgical History:  Procedure Laterality Date  . ABDOMINAL AORTIC ANEURYSM REPAIR    . ARTERIAL THROMBECTOMY    . BLADDER SURGERY    . CAROTID ENDARTERECTOMY    . CORONARY ANGIOPLASTY WITH STENT PLACEMENT    . JOINT REPLACEMENT    . KIDNEY SURGERY    . knee surgery    . left total knee arthroplasty  [id number]  . NEPHRECTOMY    . TONSILLECTOMY      Allergies as of 06/22/2018      Reactions   Ambien [zolpidem  Tartrate]    Amlodipine    Norvasc [amlodipine Besylate]    Tramadol       Medication List       Accurate as of June 22, 2018  2:20 PM. Always use your most recent med list.        albuterol 108 (90 Base) MCG/ACT inhaler Commonly known as:  PROVENTIL HFA;VENTOLIN HFA Inhale into the lungs every 6 (six) hours as needed for wheezing or shortness of breath.   aspirin 325 MG EC tablet Take 325 mg by mouth 2 (two) times daily.   atorvastatin 80 MG tablet Commonly known as:  LIPITOR Take 80 mg by mouth daily.   budesonide-formoterol 160-4.5 MCG/ACT inhaler Commonly known as:  SYMBICORT Inhale 2 puffs into the lungs 2 (two) times daily.   carvedilol 25 MG tablet Commonly known as:  COREG Take 25 mg by mouth. Take 0.5 tablets by mouth 2 times daily.   CENTRUM SILVER PO Take 1 tablet by mouth daily.   cholecalciferol 1000 units tablet Commonly known as:  VITAMIN D Take 2,000 Units by mouth daily.   clopidogrel 75 MG tablet Commonly known as:  PLAVIX Take 75 mg by mouth daily.  colchicine 0.6 MG tablet Take 0.6 mg by mouth daily.   doxylamine (Sleep) 25 MG tablet Commonly known as:  UNISOM Take 25 mg by mouth at bedtime as needed.   DULoxetine 30 MG capsule Commonly known as:  CYMBALTA Take 30 mg by mouth daily.   febuxostat 40 MG tablet Commonly known as:  ULORIC Take 40 mg by mouth daily. As needed   ferrous sulfate 325 (65 FE) MG tablet Take 325 mg by mouth daily with breakfast.   furosemide 20 MG tablet Commonly known as:  LASIX Take 20 mg by mouth 2 (two) times daily.   lisinopril 10 MG tablet Commonly known as:  PRINIVIL,ZESTRIL Take 10 mg by mouth daily.   omeprazole 20 MG capsule Commonly known as:  PRILOSEC Take 20 mg by mouth daily.   oxyCODONE-acetaminophen 5-325 MG tablet Commonly known as:  PERCOCET/ROXICET Take 1 tablet by mouth every 6 (six) hours as needed for severe pain (for 2 weeks).   rOPINIRole 2 MG tablet Commonly known as:   REQUIP Take 3 mg by mouth at bedtime.   valACYclovir 1000 MG tablet Commonly known as:  VALTREX Take 1,000 mg by mouth daily as needed.   zolpidem 5 MG tablet Commonly known as:  AMBIEN Take 5 mg by mouth at bedtime as needed for sleep.       No orders of the defined types were placed in this encounter.    There is no immunization history on file for this patient.  Social History   Tobacco Use  . Smoking status: Former Smoker    Types: Cigarettes  . Smokeless tobacco: Never Used  Substance Use Topics  . Alcohol use: Yes    Comment: 8.4 oz / week    Family history is   Family History  Problem Relation Age of Onset  . Coronary artery disease Father   . Heart disease Father   . Cancer Brother       Review of Systems  DATA OBTAINED: from patient GENERAL:  no fevers, fatigue, appetite changes SKIN: No itching, or rash EYES: No eye pain, redness, discharge EARS: No earache, tinnitus, change in hearing NOSE: No congestion, drainage or bleeding  MOUTH/THROAT: No mouth or tooth pain, No sore throat RESPIRATORY: No cough, wheezing, SOB CARDIAC: No chest pain, palpitations, lower extremity edema  GI: No abdominal pain, No N/V/D or constipation, No heartburn or reflux  GU: No dysuria, frequency or urgency, or incontinence  MUSCULOSKELETAL: No unrelieved bone/joint pain; swelling right leg NEUROLOGIC: No headache, dizziness or focal weakness PSYCHIATRIC: No c/o anxiety or sadness   Vitals:   06/22/18 1413  BP: 139/77  Pulse: 72  Resp: 18  Temp: 99.2 F (37.3 C)    SpO2 Readings from Last 1 Encounters:  No data found for SpO2   Body mass index is 23.18 kg/m.     Physical Exam  GENERAL APPEARANCE: Alert, conversant,  No acute distress.  SKIN: No diaphoresis rash HEAD: Normocephalic, atraumatic  EYES: Conjunctiva/lids clear. Pupils round, reactive. EOMs intact.  EARS: External exam WNL, canals clear. Hearing grossly normal.  NOSE: No deformity or  discharge.  MOUTH/THROAT: Lips w/o lesions  RESPIRATORY: Breathing is even, unlabored. Lung sounds are clear   CARDIOVASCULAR: Heart RRR no murmurs, rubs or gallops.  Significant swelling right lower extremity including thigh GASTROINTESTINAL: Abdomen is soft, non-tender, not distended w/ normal bowel sounds. GENITOURINARY: Bladder non tender, not distended  MUSCULOSKELETAL: No abnormal joints or musculature NEUROLOGIC:  Cranial nerves 2-12 grossly intact.  Moves all extremities  PSYCHIATRIC: Mood and affect appropriate to situation, no behavioral issues  Patient Active Problem List   Diagnosis Date Noted  . Gout 06/21/2018  . CAD S/P percutaneous coronary angioplasty 06/21/2018  . Peripheral vascular disease ([place]) 06/21/2018  . Depression 06/21/2018  . Acute posthemorrhagic anemia 12/12/2012  . Chronic airway obstruction, not elsewhere classified 12/12/2012  . Phlebitis and thrombophlebitis of other deep vessels of lower extremities 12/12/2012  . Osteoarthritis of left knee 11/14/2012  . Restless leg syndrome 11/14/2012  . Hyperlipidemia 11/14/2012  . Essential hypertension, benign 11/14/2012  . Edema 11/14/2012  . GERD (gastroesophageal reflux disease) 11/14/2012      Labs reviewed: Basic Metabolic Panel: No results found for: NA, K, CL, CO2, GLUCOSE, BUN, CREATININE, CALCIUM, PROT, ALBUMIN, AST, ALT, ALKPHOS, BILITOT, GFRNONAA, GFRAA  No results for input(s): NA, K, CL, CO2, GLUCOSE, BUN, CREATININE, CALCIUM, MG, PHOS in the last 8760 hours. Liver Function Tests: No results for input(s): AST, ALT, ALKPHOS, BILITOT, PROT, ALBUMIN in the last 8760 hours. No results for input(s): LIPASE, AMYLASE in the last 8760 hours. No results for input(s): AMMONIA in the last 8760 hours. CBC: No results for input(s): WBC, NEUTROABS, HGB, HCT, MCV, PLT in the last 8760 hours. Lipid No results for input(s): CHOL, HDL, LDLCALC, TRIG in the last 8760 hours.  Cardiac Enzymes: No results for  input(s): CKTOTAL, CKMB, CKMBINDEX, TROPONINI in the last 8760 hours. BNP: No results for input(s): BNP in the last 8760 hours. No results found for: MICROALBUR No results found for: HGBA1C No results found for: TSH No results found for: VITAMINB12 No results found for: FOLATE No results found for: IRON, TIBC, FERRITIN  Imaging and Procedures obtained prior to SNF admission: Patient was never admitted.   Not all labs, radiology exams or other studies done during hospitalization come through on my EPIC note; however they are reviewed by me.    Assessment and Plan  Right lower extremity swelling and pain- order Doppler ultrasound to rule out DVT    Muhamad Serano [name] Coil, MD

## 2018-06-23 ENCOUNTER — Non-Acute Institutional Stay (SKILLED_NURSING_FACILITY): Payer: Medicare Other | Admitting: Internal Medicine

## 2018-06-23 ENCOUNTER — Other Ambulatory Visit: Payer: Self-pay | Admitting: Internal Medicine

## 2018-06-23 DIAGNOSIS — G2581 Restless legs syndrome: Secondary | ICD-10-CM | POA: Diagnosis not present

## 2018-06-23 DIAGNOSIS — L03116 Cellulitis of left lower limb: Secondary | ICD-10-CM | POA: Diagnosis not present

## 2018-06-23 LAB — CBC AND DIFFERENTIAL
HCT: 33 — AB (ref 41–53)
HEMOGLOBIN: 11 — AB (ref 13.5–17.5)
Platelets: 388 (ref 150–399)
WBC: 12.8

## 2018-06-23 LAB — BASIC METABOLIC PANEL
BUN: 21 (ref 4–21)
Creatinine: 1.2 (ref 0.6–1.3)
Glucose: 139
Potassium: 4.5 (ref 3.4–5.3)
Sodium: 142 (ref 137–147)

## 2018-06-23 MED ORDER — OXYCODONE-ACETAMINOPHEN 5-325 MG PO TABS: 2.0000 | ORAL_TABLET | Freq: Four times a day (QID) | ORAL | 0 refills | Status: DC

## 2018-06-25 ENCOUNTER — Encounter: Payer: Self-pay | Admitting: Internal Medicine

## 2018-06-25 NOTE — Progress Notes (Signed)
Location:  [facility] of Service:  SNF (31)SNF  Hennie Duos, MD  Patient Care Team: Hennie Duos, MD as PCP - General (Internal Medicine)  Extended Emergency Contact Information Primary Emergency Contact: No,Contact  [place] Phone: [phone] Relation: Other    Allergies: Ambien [zolpidem tartrate]; Amlodipine; Norvasc [amlodipine besylate]; and Tramadol  Chief Complaint  Patient presents with  . Acute Visit    HPI: Patient is 78 y.o. male who is being seen in follow-up to yesterday for swelling of right leg.  Patient is ultrasound was negative for DVT but positive for a large amount of subcu edema.  Patient says the leg is hurting more than it did just after surgery.  Patient is dressing which had a little bit of blood earlier now has some pooled blood in the distal portion of the postop dressing.  The area is surrounding the knee is blotchy and very warm to touch.  Patient denies any fever nausea vomiting chills or other systemic symptom.  Patient says the pain is worse when his leg jerks, he has restless leg syndrome.  Past Medical History:  Diagnosis Date  . Anemia   . Aortic aneurysm ([place])   . Arthritis   . Balance problem   . Bladder cancer ([place])   . CAD (coronary artery disease)   . Cancer ([place])   . Chronic kidney disease   . COPD (chronic obstructive pulmonary disease) ([place])   . Edema   . GERD (gastroesophageal reflux disease)   . Gout   . Hearing loss   . History of heart artery stent   . Hypercholesteremia   . Hyperlipidemia   . Hypertension   . Insomnia   . Melanoma ([place])   . Osteoarthritis of both knees   . Osteopoikilosis   . Peripheral vascular disease ([place])   . Renal cancer ([place])   . Right hip pain   . RLS (restless legs syndrome)   . Stroke ([place])   . Transient ischemic attack   . Varicose vein of leg     Past Surgical History:  Procedure Laterality Date  . ABDOMINAL  AORTIC ANEURYSM REPAIR    . ARTERIAL THROMBECTOMY    . BLADDER SURGERY    . CAROTID ENDARTERECTOMY    . CORONARY ANGIOPLASTY WITH STENT PLACEMENT    . JOINT REPLACEMENT    . KIDNEY SURGERY    . knee surgery    . left total knee arthroplasty  [id number]  . NEPHRECTOMY    . TONSILLECTOMY      Allergies as of 06/23/2018      Reactions   Ambien [zolpidem Tartrate]    Amlodipine    Norvasc [amlodipine Besylate]    Tramadol       Medication List       Accurate as of June 23, 2018 11:59 PM. Always use your most recent med list.        albuterol 108 (90 Base) MCG/ACT inhaler Commonly known as:  PROVENTIL HFA;VENTOLIN HFA Inhale into the lungs every 6 (six) hours as needed for wheezing or shortness of breath.   aspirin 325 MG EC tablet Take 325 mg by mouth 2 (two) times daily.   atorvastatin 80 MG tablet Commonly known as:  LIPITOR Take 80 mg by mouth daily.   budesonide-formoterol 160-4.5 MCG/ACT inhaler Commonly known as:  SYMBICORT Inhale 2 puffs into the lungs 2 (two) times daily.   carvedilol 25 MG tablet  Commonly known as:  COREG Take 25 mg by mouth. Take 0.5 tablets by mouth 2 times daily.   CENTRUM SILVER PO Take 1 tablet by mouth daily.   cholecalciferol 1000 units tablet Commonly known as:  VITAMIN D Take 2,000 Units by mouth daily.   clopidogrel 75 MG tablet Commonly known as:  PLAVIX Take 75 mg by mouth daily.   colchicine 0.6 MG tablet Take 0.6 mg by mouth daily.   doxylamine (Sleep) 25 MG tablet Commonly known as:  UNISOM Take 25 mg by mouth at bedtime as needed.   DULoxetine 30 MG capsule Commonly known as:  CYMBALTA Take 30 mg by mouth daily.   febuxostat 40 MG tablet Commonly known as:  ULORIC Take 40 mg by mouth daily. As needed   ferrous sulfate 325 (65 FE) MG tablet Take 325 mg by mouth daily with breakfast.   furosemide 20 MG tablet Commonly known as:  LASIX Take 20 mg by mouth 2 (two) times daily.   lisinopril 10 MG  tablet Commonly known as:  PRINIVIL,ZESTRIL Take 10 mg by mouth daily.   omeprazole 20 MG capsule Commonly known as:  PRILOSEC Take 20 mg by mouth daily.   oxyCODONE-acetaminophen 5-325 MG tablet Commonly known as:  PERCOCET/ROXICET Take 2 tablets by mouth every 6 (six) hours.   rOPINIRole 2 MG tablet Commonly known as:  REQUIP Take 3 mg by mouth at bedtime.   valACYclovir 1000 MG tablet Commonly known as:  VALTREX Take 1,000 mg by mouth daily as needed.   zolpidem 5 MG tablet Commonly known as:  AMBIEN Take 5 mg by mouth at bedtime as needed for sleep.       No orders of the defined types were placed in this encounter.    There is no immunization history on file for this patient.  Social History   Tobacco Use  . Smoking status: Former Smoker    Types: Cigarettes  . Smokeless tobacco: Never Used  Substance Use Topics  . Alcohol use: Yes    Comment: 8.4 oz / week    Review of Systems  DATA OBTAINED: from patient GENERAL:  no fevers, fatigue, appetite changes SKIN: No itching, rash HEENT: No complaint RESPIRATORY: No cough, wheezing, SOB CARDIAC: No chest pain, palpitations, lower extremity edema  GI: No abdominal pain, No N/V/D or constipation, No heartburn or reflux  GU: No dysuria, frequency or urgency, or incontinence  MUSCULOSKELETAL: Increased pain in knee, as compared to several days ago NEUROLOGIC: No headache, dizziness; restless leg syndrome PSYCHIATRIC: No overt anxiety or sadness  Vitals:   06/25/18 1515  BP: 109/76  Pulse: 78  Resp: 17  Temp: 97.8 F (36.6 C)   Body mass index is 23.92 kg/m. Physical Exam  GENERAL APPEARANCE: Alert, conversant, No acute distress  SKIN: Right knee with postop dressing in place, area around dressing is very blotchy and very warm to touch with some swelling; dressing has blood on it with pooled blood in the distal part of the dressing HEENT: Unremarkable RESPIRATORY: Breathing is even, unlabored. Lung  sounds are clear   CARDIOVASCULAR: Heart RRR no murmurs, rubs or gallops. No peripheral edema  GASTROINTESTINAL: Abdomen is soft, non-tender, not distended w/ normal bowel sounds.  GENITOURINARY: Bladder non tender, not distended  MUSCULOSKELETAL: No abnormal joints or musculature NEUROLOGIC: Cranial nerves 2-12 grossly intact. Moves all extremities PSYCHIATRIC: Mood and affect appropriate to situation, no behavioral issues  Patient Active Problem List   Diagnosis Date Noted  . Gout  06/21/2018  . CAD S/P percutaneous coronary angioplasty 06/21/2018  . Peripheral vascular disease ([place]) 06/21/2018  . Depression 06/21/2018  . Acute posthemorrhagic anemia 12/12/2012  . Chronic airway obstruction, not elsewhere classified 12/12/2012  . Phlebitis and thrombophlebitis of other deep vessels of lower extremities 12/12/2012  . Osteoarthritis of left knee 11/14/2012  . Restless leg syndrome 11/14/2012  . Hyperlipidemia 11/14/2012  . Essential hypertension, benign 11/14/2012  . Edema 11/14/2012  . GERD (gastroesophageal reflux disease) 11/14/2012    CMP     Component Value Date/Time   NA 142 06/23/2018   K 4.5 06/23/2018   BUN 21 06/23/2018   CREATININE 1.2 06/23/2018   Recent Labs    06/23/18  NA 142  K 4.5  BUN 21  CREATININE 1.2   No results for input(s): AST, ALT, ALKPHOS, BILITOT, PROT, ALBUMIN in the last 8760 hours. Recent Labs    06/23/18  WBC 12.8  HGB 11.0*  HCT 33*  PLT 388   No results for input(s): CHOL, LDLCALC, TRIG in the last 8760 hours.  Invalid input(s): HCL No results found for: MICROALBUR No results found for: TSH No results found for: HGBA1C No results found for: CHOL, HDL, LDLCALC, LDLDIRECT, TRIG, CHOLHDL  Significant Diagnostic Results in last 30 days:  No results found.  Assessment and Plan  Postop knee cellulitis- wound care nurse spoke with patient's orthopedist Dr. Len Childs who gave  permission to remove the postop dressing; incision area  looks good; area of bleeding looks old; I have written orders for doxycycline 100 mg twice daily for 10 days, and will schedule Percocet 5 mg 2 p.o. every 6  Restless leg syndrome- patient takes 3 mg of Requip at night so max dose is 4 times daily, was comfortable with Requip 1 mg every morning, which was written.    Time spent 35 minutes;> 50% of time with patient was spent reviewing records, labs, tests and studies, counseling and developing plan of care  Inocencio Homes, MD

## 2018-07-01 ENCOUNTER — Other Ambulatory Visit: Payer: Self-pay | Admitting: Internal Medicine

## 2018-07-01 ENCOUNTER — Non-Acute Institutional Stay (SKILLED_NURSING_FACILITY): Payer: Medicare Other | Admitting: Internal Medicine

## 2018-07-01 ENCOUNTER — Encounter: Payer: Self-pay | Admitting: Internal Medicine

## 2018-07-01 DIAGNOSIS — G2581 Restless legs syndrome: Secondary | ICD-10-CM

## 2018-07-01 DIAGNOSIS — I2510 Atherosclerotic heart disease of native coronary artery without angina pectoris: Secondary | ICD-10-CM | POA: Diagnosis not present

## 2018-07-01 DIAGNOSIS — M1A9XX0 Chronic gout, unspecified, without tophus (tophi): Secondary | ICD-10-CM

## 2018-07-01 DIAGNOSIS — Z9861 Coronary angioplasty status: Secondary | ICD-10-CM

## 2018-07-01 DIAGNOSIS — F329 Major depressive disorder, single episode, unspecified: Secondary | ICD-10-CM

## 2018-07-01 DIAGNOSIS — I739 Peripheral vascular disease, unspecified: Secondary | ICD-10-CM

## 2018-07-01 DIAGNOSIS — F32A Depression, unspecified: Secondary | ICD-10-CM

## 2018-07-01 DIAGNOSIS — M1712 Unilateral primary osteoarthritis, left knee: Secondary | ICD-10-CM

## 2018-07-01 DIAGNOSIS — E785 Hyperlipidemia, unspecified: Secondary | ICD-10-CM

## 2018-07-01 DIAGNOSIS — I10 Essential (primary) hypertension: Secondary | ICD-10-CM

## 2018-07-01 MED ORDER — DULOXETINE HCL 30 MG PO CPEP: 30.0000 mg | ORAL_CAPSULE | Freq: Every day | ORAL | 0 refills | Status: DC

## 2018-07-01 MED ORDER — CARVEDILOL 25 MG PO TABS: 12.5000 mg | ORAL_TABLET | Freq: Two times a day (BID) | ORAL | 0 refills | Status: AC

## 2018-07-01 MED ORDER — FERROUS SULFATE 325 (65 FE) MG PO TABS: 325.0000 mg | ORAL_TABLET | Freq: Every day | ORAL | 0 refills | Status: DC

## 2018-07-01 MED ORDER — BUDESONIDE-FORMOTEROL FUMARATE 160-4.5 MCG/ACT IN AERO: 2.0000 | INHALATION_SPRAY | Freq: Two times a day (BID) | RESPIRATORY_TRACT | 0 refills | Status: AC

## 2018-07-01 MED ORDER — OMEPRAZOLE 20 MG PO CPDR: 20.0000 mg | DELAYED_RELEASE_CAPSULE | Freq: Every day | ORAL | 0 refills | Status: DC

## 2018-07-01 MED ORDER — FERROUS SULFATE 325 (65 FE) MG PO TABS: 325.0000 mg | ORAL_TABLET | Freq: Every day | ORAL | 0 refills | Status: AC

## 2018-07-01 MED ORDER — OMEPRAZOLE 20 MG PO CPDR: 20.0000 mg | DELAYED_RELEASE_CAPSULE | Freq: Every day | ORAL | 0 refills | Status: AC

## 2018-07-01 MED ORDER — FEBUXOSTAT 40 MG PO TABS: 40.0000 mg | ORAL_TABLET | Freq: Every day | ORAL | 0 refills | Status: DC

## 2018-07-01 MED ORDER — FUROSEMIDE 20 MG PO TABS: 20.0000 mg | ORAL_TABLET | Freq: Every day | ORAL | 0 refills | Status: AC

## 2018-07-01 MED ORDER — OXYCODONE-ACETAMINOPHEN 5-325 MG PO TABS: 1.0000 | ORAL_TABLET | Freq: Four times a day (QID) | ORAL | 0 refills | Status: AC

## 2018-07-01 MED ORDER — DULOXETINE HCL 30 MG PO CPEP: 30.0000 mg | ORAL_CAPSULE | Freq: Every day | ORAL | 0 refills | Status: AC

## 2018-07-01 MED ORDER — ZOLPIDEM TARTRATE 5 MG PO TABS: 2.5000 mg | ORAL_TABLET | Freq: Every evening | ORAL | 0 refills | Status: AC | PRN

## 2018-07-01 MED ORDER — CARVEDILOL 25 MG PO TABS: 12.5000 mg | ORAL_TABLET | Freq: Two times a day (BID) | ORAL | 0 refills | Status: DC

## 2018-07-01 MED ORDER — ROPINIROLE HCL 2 MG PO TABS: ORAL_TABLET | ORAL | 0 refills | Status: AC

## 2018-07-01 MED ORDER — ATORVASTATIN CALCIUM 80 MG PO TABS: 80.0000 mg | ORAL_TABLET | Freq: Every day | ORAL | 0 refills | Status: DC

## 2018-07-01 MED ORDER — LISINOPRIL 10 MG PO TABS: 10.0000 mg | ORAL_TABLET | Freq: Every day | ORAL | 0 refills | Status: AC

## 2018-07-01 MED ORDER — FEBUXOSTAT 40 MG PO TABS: 40.0000 mg | ORAL_TABLET | Freq: Every day | ORAL | 0 refills | Status: AC

## 2018-07-01 MED ORDER — ATORVASTATIN CALCIUM 80 MG PO TABS: 80.0000 mg | ORAL_TABLET | Freq: Every day | ORAL | 0 refills | Status: AC

## 2018-07-01 MED ORDER — LISINOPRIL 10 MG PO TABS: 10.0000 mg | ORAL_TABLET | Freq: Every day | ORAL | 0 refills | Status: DC

## 2018-07-01 MED ORDER — COLCHICINE 0.6 MG PO TABS: 0.6000 mg | ORAL_TABLET | Freq: Every day | ORAL | 0 refills | Status: DC | PRN

## 2018-07-01 MED ORDER — OXYCODONE-ACETAMINOPHEN 5-325 MG PO TABS: 1.0000 | ORAL_TABLET | Freq: Four times a day (QID) | ORAL | 0 refills | Status: DC

## 2018-07-01 MED ORDER — CLOPIDOGREL BISULFATE 75 MG PO TABS: 75.0000 mg | ORAL_TABLET | Freq: Every day | ORAL | 0 refills | Status: AC

## 2018-07-01 MED ORDER — FUROSEMIDE 20 MG PO TABS: 20.0000 mg | ORAL_TABLET | Freq: Every day | ORAL | 0 refills | Status: DC

## 2018-07-01 MED ORDER — BUDESONIDE-FORMOTEROL FUMARATE 160-4.5 MCG/ACT IN AERO: 2.0000 | INHALATION_SPRAY | Freq: Two times a day (BID) | RESPIRATORY_TRACT | 0 refills | Status: DC

## 2018-07-01 MED ORDER — POLYETHYLENE GLYCOL 3350 17 G PO PACK: 17.0000 g | PACK | Freq: Every day | ORAL | 0 refills | Status: DC | PRN

## 2018-07-01 MED ORDER — COLCHICINE 0.6 MG PO TABS: 0.6000 mg | ORAL_TABLET | Freq: Every day | ORAL | 0 refills | Status: AC | PRN

## 2018-07-01 MED ORDER — CLOPIDOGREL BISULFATE 75 MG PO TABS: 75.0000 mg | ORAL_TABLET | Freq: Every day | ORAL | 0 refills | Status: DC

## 2018-07-01 MED ORDER — POLYETHYLENE GLYCOL 3350 17 G PO PACK: 17.0000 g | PACK | Freq: Every day | ORAL | 0 refills | Status: AC | PRN

## 2018-07-01 MED ORDER — ROPINIROLE HCL 2 MG PO TABS: 1.0000 mg | ORAL_TABLET | Freq: Every morning | ORAL | 0 refills | Status: DC

## 2018-07-01 MED ORDER — ZOLPIDEM TARTRATE 5 MG PO TABS: 2.5000 mg | ORAL_TABLET | Freq: Every evening | ORAL | 0 refills | Status: DC | PRN

## 2018-07-01 NOTE — Progress Notes (Addendum)
Location:  [facility] of Service:  SNF (31)  PCP: Hennie Duos, MD Patient Care Team: Hennie Duos, MD as PCP - General (Internal Medicine)  Extended Emergency Contact Information Primary Emergency Contact: No,Contact  [place] Phone: [phone] Relation: Other  Allergies  Allergen Reactions  . Ambien [Zolpidem Tartrate]   . Amlodipine   . Norvasc [Amlodipine Besylate]   . Tramadol     Chief Complaint  Patient presents with  . Discharge Note    HPI:  78 y.o. male with hypertension, GERD, restless leg syndrome, gout, history of stroke, hyperlipidemia, who was admitted to [facility] from 1/20 2-25 for a planned right total knee arthroplasty per Dr. Charna Archer.  Patient was started on ASA 325 mg twice daily as prophylaxis, and compression hose for DVT prophylaxis as well.  Patient was admitted to skilled nursing facility for OT/PT and is now ready to be discharged home.    Past Medical History:  Diagnosis Date  . Anemia   . Aortic aneurysm ([place])   . Arthritis   . Balance problem   . Bladder cancer ([place])   . CAD (coronary artery disease)   . Cancer ([place])   . Chronic kidney disease   . COPD (chronic obstructive pulmonary disease) ([place])   . Edema   . GERD (gastroesophageal reflux disease)   . Gout   . Hearing loss   . History of heart artery stent   . Hypercholesteremia   . Hyperlipidemia   . Hypertension   . Insomnia   . Melanoma ([place])   . Osteoarthritis of both knees   . Osteopoikilosis   . Peripheral vascular disease ([place])   . Renal cancer ([place])   . Right hip pain   . RLS (restless legs syndrome)   . Stroke ([place])   . Transient ischemic attack   . Varicose vein of leg     Past Surgical History:  Procedure Laterality Date  . ABDOMINAL AORTIC ANEURYSM REPAIR    . ARTERIAL THROMBECTOMY    . BLADDER SURGERY    . CAROTID ENDARTERECTOMY    . CORONARY ANGIOPLASTY WITH STENT PLACEMENT      . JOINT REPLACEMENT    . KIDNEY SURGERY    . knee surgery    . left total knee arthroplasty  [id number]  . NEPHRECTOMY    . TONSILLECTOMY       reports that he has quit smoking. His smoking use included cigarettes. He has never used smokeless tobacco. He reports current alcohol use. No history on file for drug. Social History   Socioeconomic History  . Marital status: Single    Spouse name: Not on file  . Number of children: Not on file  . Years of education: Not on file  . Highest education level: Not on file  Occupational History  . Not on file  Social Needs  . Financial resource strain: Not on file  . Food insecurity:    Worry: Not on file    Inability: Not on file  . Transportation needs:    Medical: Not on file    Non-medical: Not on file  Tobacco Use  . Smoking status: Former Smoker    Types: Cigarettes  . Smokeless tobacco: Never Used  Substance and Sexual Activity  . Alcohol use: Yes    Comment: 8.4 oz / week  . Drug use: Not on file  . Sexual activity: Not on file  Lifestyle  . Physical activity:    Days per week: Not on file    Minutes per session: Not on file  . Stress: Not on file  Relationships  . Social connections:    Talks on phone: Not on file    Gets together: Not on file    Attends religious service: Not on file    Active member of club or organization: Not on file    Attends meetings of clubs or organizations: Not on file    Relationship status: Not on file  . Intimate partner violence:    Fear of current or ex partner: Not on file    Emotionally abused: Not on file    Physically abused: Not on file    Forced sexual activity: Not on file  Other Topics Concern  . Not on file  Social History Narrative  . Not on file    Pertinent  Health Maintenance Due  Topic Date Due  . PNA vac Low Risk Adult (1 of 2 - PCV13) 08/04/2005  . INFLUENZA VACCINE  12/23/2017    Medications: Allergies as of 07/01/2018      Reactions   Ambien [zolpidem  Tartrate]    Amlodipine    Norvasc [amlodipine Besylate]    Tramadol       Medication List       Accurate as of July 01, 2018 11:43 PM. Always use your most recent med list.        aspirin 325 MG EC tablet Take 325 mg by mouth 2 (two) times daily. Stop date 2/14   atorvastatin 80 MG tablet Commonly known as:  LIPITOR Take 1 tablet (80 mg total) by mouth daily.   budesonide-formoterol 160-4.5 MCG/ACT inhaler Commonly known as:  SYMBICORT Inhale 2 puffs into the lungs 2 (two) times daily.   carvedilol 25 MG tablet Commonly known as:  COREG Take 0.5 tablets (12.5 mg total) by mouth 2 (two) times daily with a meal. Take 0.5 tablets by mouth 2 times daily.   CENTRUM SILVER PO Take 1 tablet by mouth daily.   cholecalciferol 1000 units tablet Commonly known as:  VITAMIN D Take 2,000 Units by mouth daily.   clopidogrel 75 MG tablet Commonly known as:  PLAVIX Take 1 tablet (75 mg total) by mouth daily.   colchicine 0.6 MG tablet Take 1 tablet (0.6 mg total) by mouth daily as needed.   doxylamine (Sleep) 25 MG tablet Commonly known as:  UNISOM Take 25 mg by mouth at bedtime as needed.   DULoxetine 30 MG capsule Commonly known as:  CYMBALTA Take 1 capsule (30 mg total) by mouth daily.   febuxostat 40 MG tablet Commonly known as:  ULORIC Take 1 tablet (40 mg total) by mouth daily.   ferrous sulfate 325 (65 FE) MG tablet Take 1 tablet (325 mg total) by mouth daily with breakfast.   furosemide 20 MG tablet Commonly known as:  LASIX Take 1 tablet (20 mg total) by mouth daily.   lisinopril 10 MG tablet Commonly known as:  PRINIVIL,ZESTRIL Take 1 tablet (10 mg total) by mouth daily.   omeprazole 20 MG capsule Commonly known as:  PRILOSEC Take 1 capsule (20 mg total) by mouth daily.   oxyCODONE-acetaminophen 5-325 MG tablet Commonly known as:  PERCOCET/ROXICET Take 1-2 tablets by mouth every 6 (six) hours.   polyethylene glycol packet Commonly known as:   MIRALAX / GLYCOLAX Take 17 g by mouth daily as needed.   rOPINIRole 2 MG tablet  Commonly known as:  REQUIP 0.5 tabs (1 mg) po q am and 1.5 tabs (3 mg) po qHS   zolpidem 5 MG tablet Commonly known as:  AMBIEN Take 0.5 tablets (2.5 mg total) by mouth at bedtime as needed for sleep.        Vitals:   07/01/18 1729  BP: 132/65  Pulse: 68  Resp: 17  Temp: (!) 97.4 F (36.3 C)   There is no height or weight on file to calculate BMI.  Physical Exam  GENERAL APPEARANCE: Alert, conversant. No acute distress.  HEENT: Unremarkable. RESPIRATORY: Breathing is even, unlabored. Lung sounds are clear   CARDIOVASCULAR: Heart RRR no murmurs, rubs or gallops. No peripheral edema.  GASTROINTESTINAL: Abdomen is soft, non-tender, not distended w/ normal bowel sounds.  NEUROLOGIC: Cranial nerves 2-12 grossly intact. Moves all extremities   Labs reviewed: Basic Metabolic Panel: Recent Labs    06/23/18  NA 142  K 4.5  BUN 21  CREATININE 1.2   No results found for: [facility] Liver Function Tests: No results for input(s): AST, ALT, ALKPHOS, BILITOT, PROT, ALBUMIN in the last 8760 hours. No results for input(s): LIPASE, AMYLASE in the last 8760 hours. No results for input(s): AMMONIA in the last 8760 hours. CBC: Recent Labs    06/23/18  WBC 12.8  HGB 11.0*  HCT 33*  PLT 388   Lipid No results for input(s): CHOL, HDL, LDLCALC, TRIG in the last 8760 hours. Cardiac Enzymes: No results for input(s): CKTOTAL, CKMB, CKMBINDEX, TROPONINI in the last 8760 hours. BNP: No results for input(s): BNP in the last 8760 hours. CBG: No results for input(s): GLUCAP in the last 8760 hours.  Procedures and Imaging Studies During Stay: No results found.  Assessment/Plan:   Primary osteoarthritis of left knee  Essential hypertension, benign  Chronic gout without tophus, unspecified cause, unspecified site  CAD S/P percutaneous coronary angioplasty  Peripheral vascular disease  (HCC)  Restless leg syndrome  Hyperlipidemia, unspecified hyperlipidemia type  Depression, unspecified depression type   Patient is being discharged with the following home health services: OT/PT/nursing  Patient is being discharged with the following durable medical equipment: None  Patient has been advised to f/u with their PCP in 1-2 weeks to bring them up to date on their rehab stay.  Social services at facility was responsible for arranging this appointment.  Pt was provided with a 30 day supply of prescriptions for medications and refills must be obtained from their PCP.  For controlled substances, a more limited supply may be provided adequate until PCP appointment only.  Medications have been reconciled.  Time spent greater than 30 minutes;> 50% of time with patient was spent reviewing records, labs, tests and studies, counseling and developing plan of care  Inocencio Homes, MD

## 2018-07-02 ENCOUNTER — Other Ambulatory Visit: Payer: Self-pay | Admitting: Internal Medicine

## 2018-07-05 DIAGNOSIS — N183 Chronic kidney disease, stage 3 (moderate): Secondary | ICD-10-CM

## 2018-07-05 DIAGNOSIS — Z471 Aftercare following joint replacement surgery: Secondary | ICD-10-CM

## 2018-07-05 DIAGNOSIS — I129 Hypertensive chronic kidney disease with stage 1 through stage 4 chronic kidney disease, or unspecified chronic kidney disease: Secondary | ICD-10-CM

## 2018-07-05 DIAGNOSIS — I739 Peripheral vascular disease, unspecified: Secondary | ICD-10-CM

## 2018-07-05 DIAGNOSIS — Z4801 Encounter for change or removal of surgical wound dressing: Secondary | ICD-10-CM | POA: Diagnosis not present

## 2018-07-05 DIAGNOSIS — I2510 Atherosclerotic heart disease of native coronary artery without angina pectoris: Secondary | ICD-10-CM

## 2018-07-05 DIAGNOSIS — Z96651 Presence of right artificial knee joint: Secondary | ICD-10-CM

## 2018-07-05 DIAGNOSIS — M1712 Unilateral primary osteoarthritis, left knee: Secondary | ICD-10-CM

## 2018-07-24 ENCOUNTER — Other Ambulatory Visit: Payer: Self-pay | Admitting: Internal Medicine

## 2018-08-02 ENCOUNTER — Other Ambulatory Visit: Payer: Self-pay | Admitting: Internal Medicine

## 2019-03-01 ENCOUNTER — Other Ambulatory Visit: Payer: Self-pay | Admitting: Internal Medicine

## 2019-03-26 ENCOUNTER — Other Ambulatory Visit: Payer: Self-pay | Admitting: Internal Medicine

## 2019-04-25 ENCOUNTER — Other Ambulatory Visit: Payer: Self-pay | Admitting: Internal Medicine

## 2019-05-16 ENCOUNTER — Other Ambulatory Visit: Payer: Self-pay | Admitting: Internal Medicine

## 2020-08-04 ENCOUNTER — Emergency Department (HOSPITAL_COMMUNITY)
Admission: EM | Admit: 2020-08-04 | Discharge: 2020-08-04 | Disposition: A | Discharge status: Home / Self Care | Payer: Medicare Other | Attending: Emergency Medicine | Admitting: Emergency Medicine

## 2020-08-04 ENCOUNTER — Emergency Department (HOSPITAL_COMMUNITY): Payer: Medicare Other

## 2020-08-04 ENCOUNTER — Other Ambulatory Visit: Payer: Self-pay

## 2020-08-04 DIAGNOSIS — W01198A Fall on same level from slipping, tripping and stumbling with subsequent striking against other object, initial encounter: Secondary | ICD-10-CM | POA: Diagnosis not present

## 2020-08-04 DIAGNOSIS — Y92009 Unspecified place in unspecified non-institutional (private) residence as the place of occurrence of the external cause: Secondary | ICD-10-CM | POA: Diagnosis not present

## 2020-08-04 DIAGNOSIS — S60221A Contusion of right hand, initial encounter: Secondary | ICD-10-CM | POA: Insufficient documentation

## 2020-08-04 DIAGNOSIS — S0181XA Laceration without foreign body of other part of head, initial encounter: Secondary | ICD-10-CM | POA: Diagnosis not present

## 2020-08-04 DIAGNOSIS — S0083XA Contusion of other part of head, initial encounter: Secondary | ICD-10-CM

## 2020-08-04 DIAGNOSIS — Z7902 Long term (current) use of antithrombotics/antiplatelets: Secondary | ICD-10-CM | POA: Insufficient documentation

## 2020-08-04 DIAGNOSIS — Z7982 Long term (current) use of aspirin: Secondary | ICD-10-CM | POA: Diagnosis not present

## 2020-08-04 DIAGNOSIS — W19XXXA Unspecified fall, initial encounter: Secondary | ICD-10-CM

## 2020-08-04 DIAGNOSIS — M7989 Other specified soft tissue disorders: Secondary | ICD-10-CM | POA: Insufficient documentation

## 2020-08-04 DIAGNOSIS — S0990XA Unspecified injury of head, initial encounter: Secondary | ICD-10-CM | POA: Diagnosis present

## 2020-08-04 DIAGNOSIS — I6782 Cerebral ischemia: Secondary | ICD-10-CM | POA: Diagnosis not present

## 2020-08-04 LAB — COMPREHENSIVE METABOLIC PANEL
ALT: 21 U/L (ref 0–44)
AST: 27 U/L (ref 15–41)
Albumin: 3.6 g/dL (ref 3.5–5.0)
Alkaline Phosphatase: 88 U/L (ref 38–126)
Anion gap: 11 (ref 5–15)
BUN: 14 mg/dL (ref 8–23)
CO2: 23 mmol/L (ref 22–32)
Calcium: 9.4 mg/dL (ref 8.9–10.3)
Chloride: 105 mmol/L (ref 98–111)
Creatinine, Ser: 1.39 mg/dL — ABNORMAL HIGH (ref 0.61–1.24)
GFR, Estimated: 51 mL/min — ABNORMAL LOW (ref >60)
Glucose, Bld: 116 mg/dL — ABNORMAL HIGH (ref 70–99)
Potassium: 3 mmol/L — ABNORMAL LOW (ref 3.5–5.1)
Sodium: 139 mmol/L (ref 135–145)
Total Bilirubin: 1 mg/dL (ref 0.3–1.2)
Total Protein: 6.3 g/dL — ABNORMAL LOW (ref 6.5–8.1)

## 2020-08-04 LAB — CBC WITH DIFFERENTIAL/PLATELET
Abs Immature Granulocytes: 0.1 10*3/uL — ABNORMAL HIGH (ref 0.00–0.07)
Basophils Absolute: 0.1 10*3/uL (ref 0.0–0.1)
Basophils Relative: 1 %
Eosinophils Absolute: 0.6 10*3/uL — ABNORMAL HIGH (ref 0.0–0.5)
Eosinophils Relative: 3 %
HCT: 44.1 % (ref 39.0–52.0)
Hemoglobin: 14.8 g/dL (ref 13.0–17.0)
Immature Granulocytes: 1 %
Lymphocytes Relative: 13 %
Lymphs Abs: 2.2 10*3/uL (ref 0.7–4.0)
MCH: 33.3 pg (ref 26.0–34.0)
MCHC: 33.6 g/dL (ref 30.0–36.0)
MCV: 99.3 fL (ref 80.0–100.0)
Monocytes Absolute: 2.3 10*3/uL — ABNORMAL HIGH (ref 0.1–1.0)
Monocytes Relative: 14 %
Neutro Abs: 11.2 10*3/uL — ABNORMAL HIGH (ref 1.7–7.7)
Neutrophils Relative %: 68 %
Platelets: 262 10*3/uL (ref 150–400)
RBC: 4.44 MIL/uL (ref 4.22–5.81)
RDW: 14.7 % (ref 11.5–15.5)
WBC: 16.5 10*3/uL — ABNORMAL HIGH (ref 4.0–10.5)
nRBC: 0 % (ref 0.0–0.2)

## 2020-08-04 LAB — ETHANOL: Alcohol, Ethyl (B): 72 mg/dL — ABNORMAL HIGH (ref <10)

## 2020-08-04 MED ORDER — TETANUS-DIPHTH-ACELL PERTUSSIS 5-2.5-18.5 LF-MCG/0.5 IM SUSY: 0.5000 mL | PREFILLED_SYRINGE | Freq: Once | INTRAMUSCULAR | Status: DC

## 2020-08-04 NOTE — Discharge Instructions (Addendum)
Keep wounds on the face clean with soap and warm water. Follow-up with your primary care doctor. Return here for new concerns.

## 2020-08-04 NOTE — ED Triage Notes (Signed)
Patient to ED via EMS for complaints of GLF with head injury. States that he was on patio, fell and hit right eyebrow. Positive LOC, patient also has laceration to right eyebrow. Bleeding controled with pressure dressing. Patient endorses use of ETOH prior to fall and is on blood thinners.

## 2020-08-04 NOTE — ED Notes (Signed)
Pat to CT

## 2020-08-04 NOTE — ED Provider Notes (Addendum)
[facility] DEPART[facility] Provider Note   CSN: [id number] Arrival date & time: 08/04/20  0121     History Chief Complaint  Patient presents with  . Fall    Zachary Sloan is a 80 y.o. male.  The history is provided by the EMS personnel.    80 y.o. M presenting to the ED as LEVEL II TRAUMA.   Patient sustained fall on back porch at home.  Obvious head injury, was laying outside for approx 1 hour prior to EMS arrival.  Does admit to drinking several vodka soda's tonight because it is his birthday.  He is on ASA and plavix.  Unsure last tetanus.  Per EMS, was able to stand on scene and walk to stretcher.  No past medical history on file.  There are no problems to display for this patient.       No family history on file.     Home Medications Prior to Admission medications   Not on File    Allergies    Patient has no allergy information on record.  Review of Systems   Review of Systems  Skin: Positive for wound.  All other systems reviewed and are negative.   Physical Exam Updated Vital Signs BP (!) 178/90   Pulse 69   Temp (!) 97.3 F (36.3 C) (Temporal)   Resp 19   Ht 5\' 7"  (1.702 m)   Wt 63.5 kg   SpO2 99%   BMI 21.93 kg/m   Physical Exam Vitals and nursing note reviewed.  Constitutional:      Appearance: He is well-developed.  HENT:     Head: Normocephalic and atraumatic.     Comments: Multiple abrasions noted to right forehead, small, poinpoint wound in center, slowly oozing blood, no pulsatile bleeding Dried blood over forehead and in hair Eyes:     Conjunctiva/sclera: Conjunctivae normal.     Pupils: Pupils are equal, round, and reactive to light.     Comments: PERRL  Neck:     Comments: c-collar in place Cardiovascular:     Rate and Rhythm: Normal rate and regular rhythm.     Heart sounds: Normal heart sounds.  Pulmonary:     Effort: Pulmonary effort is normal.     Breath sounds: Normal breath sounds.   Abdominal:     General: Bowel sounds are normal.     Palpations: Abdomen is soft.  Musculoskeletal:        General: Normal range of motion.     Comments: Swelling noted to proximal right forearm, bruising noted to right hand, abrasions noted to fingers  Skin:    General: Skin is warm and dry.  Neurological:     Mental Status: He is alert.     Comments: Awake, alert, moving extremities well , no focal deficits     ED Results / Procedures / Treatments   Labs (all labs ordered are listed, but only abnormal results are displayed) Labs Reviewed  CBC WITH DIFFERENTIAL/PLATELET - Abnormal; Notable for the following components:      Result Value   WBC 16.5 (*)    Neutro Abs 11.2 (*)    Monocytes Absolute 2.3 (*)    Eosinophils Absolute 0.6 (*)    Abs Immature Granulocytes 0.10 (*)    All other components within normal limits  COMPREHENSIVE METABOLIC PANEL - Abnormal; Notable for the following components:   Potassium 3.0 (*)    Glucose, Bld 116 (*)    Creatinine, Ser 1.39 (*)  Total Protein 6.3 (*)    GFR, Estimated 51 (*)    All other components within normal limits  ETHANOL - Abnormal; Notable for the following components:   Alcohol, Ethyl (B) 72 (*)    All other components within normal limits    EKG None  Radiology DG Forearm Right  Result Date: 08/04/2020 CLINICAL DATA:  [place], anticoagulated EXAM: RIGHT FOREARM - 2 VIEW COMPARISON:  None. FINDINGS: Frontal and lateral views of the right forearm are obtained. There are no acute displaced fractures. Alignment of the right elbow and wrist is anatomic. Mild soft tissue swelling lateral ventral aspect of the proximal forearm. IMPRESSION: 1. Proximal soft tissue swelling.  No acute fracture. Electronically Signed   By: Randa Ngo M.D.   On: 08/04/2020 03:19   CT Head Wo Contrast  Result Date: 08/04/2020 CLINICAL DATA:  Facial trauma, fell, anticoagulated EXAM: CT HEAD WITHOUT CONTRAST TECHNIQUE: Contiguous axial images  were obtained from the base of the skull through the vertex without intravenous contrast. COMPARISON:  01/27/2018, 06/24/2020 FINDINGS: Brain: Chronic small vessel ischemic changes are seen throughout the bilateral basal ganglia and periventricular white matter. No signs of acute infarct or hemorrhage. Asymmetry of the lateral ventricles, left larger than right, a chronic finding. Remaining midline structures are unremarkable. No acute extra-axial fluid collections. No mass effect. Vascular: Stable atherosclerosis.  No hyperdense vessel. Skull: Right supraorbital soft tissue swelling. No underlying fracture. The remainder of the calvarium is unremarkable. Sinuses/Orbits: No acute finding. Other: None. IMPRESSION: 1. No acute intracranial process. 2. Right supraorbital soft tissue swelling. 3. Chronic small vessel ischemic changes throughout the white matter and bilateral basal ganglia. Electronically Signed   By: Randa Ngo M.D.   On: 08/04/2020 03:05   CT Cervical Spine Wo Contrast  Result Date: 08/04/2020 CLINICAL DATA:  [place], facial trauma EXAM: CT CERVICAL SPINE WITHOUT CONTRAST TECHNIQUE: Multidetector CT imaging of the cervical spine was performed without intravenous contrast. Multiplanar CT image reconstructions were also generated. COMPARISON:  None. FINDINGS: Alignment: Mild anterolisthesis of C7 on T1 likely due to extensive facet hypertrophic changes. Otherwise alignment is anatomic. Skull base and vertebrae: No acute fracture. No primary bone lesion or focal pathologic process. Soft tissues and spinal canal: No prevertebral fluid or swelling. No visible canal hematoma. Extensive atherosclerosis of the carotid arteries. Disc levels: Prominent spondylosis at C5-6 and C6-7. There is diffuse facet hypertrophic change. Bilateral neural foraminal encroachment at C5-6 and C6-7, with right predominant neural foraminal narrowing at C3-4. Upper chest: Airways patent. Emphysematous changes and scarring are  noted at the lung apices. Other: Reconstructed images demonstrate no additional findings. IMPRESSION: 1. No acute cervical spine fracture. 2. Extensive multilevel cervical spondylosis and facet hypertrophy as above. Electronically Signed   By: Randa Ngo M.D.   On: 08/04/2020 03:08   DG Hand Complete Right  Result Date: 08/04/2020 CLINICAL DATA:  Fall EXAM: RIGHT HAND - COMPLETE 3+ VIEW COMPARISON:  None. FINDINGS: There is no evidence of fracture or dislocation. 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 There is joint space narrowing and osteophytosis of the first interphalangeal joint. Soft tissues are unremarkable. IMPRESSION: No fracture or dislocation of the right hand. Electronically Signed   By: Ulyses Jarred M.D.   On: 08/04/2020 03:25    Procedures Procedures   LACERATION REPAIR Performed by: Larene Pickett Authorized by: Larene Pickett Consent: Verbal consent obtained. Risks and benefits: risks, benefits and alternatives were discussed Consent given by: patient Patient identity confirmed:  provided demographic data Prepped and Draped in normal sterile fashion Wound explored  Laceration Location: right forhead  Laceration Length: pinpoint  No Foreign Bodies seen or palpated  Anesthesia: none  Local anesthetic: none  Anesthetic total: 0 ml  Irrigation method: syringe Amount of cleaning: standard  Skin closure: dermabond  Number of sutures: n/a  Technique: n/a  Patient tolerance: Patient tolerated the procedure well with no immediate complications.   Medications Ordered in ED Medications - No data to display  ED Course  I have reviewed the triage vital signs and the nursing notes.  Pertinent labs & imaging results that were available during my care of the patient were reviewed by me and considered in my medical decision making (see chart for details).    MDM Rules/Calculators/A&P  80 y.o. M presenting to the ED as LEVEL II TRAUMA--  fall on thinners.  Has had a few vodka soda's tonight for his birthday.  He is on ASA and plavix.  He is awake, alert, here.  Somewhat intoxicated.  Does have multiple abrasions to right forehead along with some swelling to right forearm and bruising right hand.  VSS.  Will obtain labs, EKG, CT head/neck, films of forearm and right hand.  Tetanus will be updated.  Imaging negative for acute findings.  Patient resting for now.  Will monitor.  4:36 AM Patient awake, alert. He is agitated and argumentative with me when trying to remove his c-collar.  Ultimately this was removed and he is ranging neck without difficulty.  Dressing removed on the forehead, still some oozing of blood from abrasions and appears to have a small punctate wound.  Will provide wound care and re-check.  5:10 AM Patient much happier after he was cleaned up.  Abrasions and wound clearly visible now, minimal oozing of blood.  dermabond was applied with cessation of bleeding.  He is comfortable at present.  Given water and coke.  5:50 AM Tolerating PO.  Has been able to get up and walk around here in the ED.  Stable for discharge home.  Discussed home wound care to abrasions of face at home.  Follow-up with PCP.  Return here for new concerns.  Final Clinical Impression(s) / ED Diagnoses Final diagnoses:  Fall, initial encounter  Contusion of face, initial encounter    Rx / DC Orders ED Discharge Orders    None       Larene Pickett, PA-C 08/04/20 0552    Larene Pickett, PA-C 08/04/20 2238    Merryl Hacker, MD 08/05/20 [phone]

## 2021-04-24 DEATH — deceased

## 2022-09-24 IMAGING — CR DG FOREARM 2V*R*
2 series · 2 of 2 positions shown · non-contrast
Comparison: None.

CLINICAL DATA: Fell, anticoagulated

EXAM:
RIGHT FOREARM - 2 VIEW

[forearm ap]
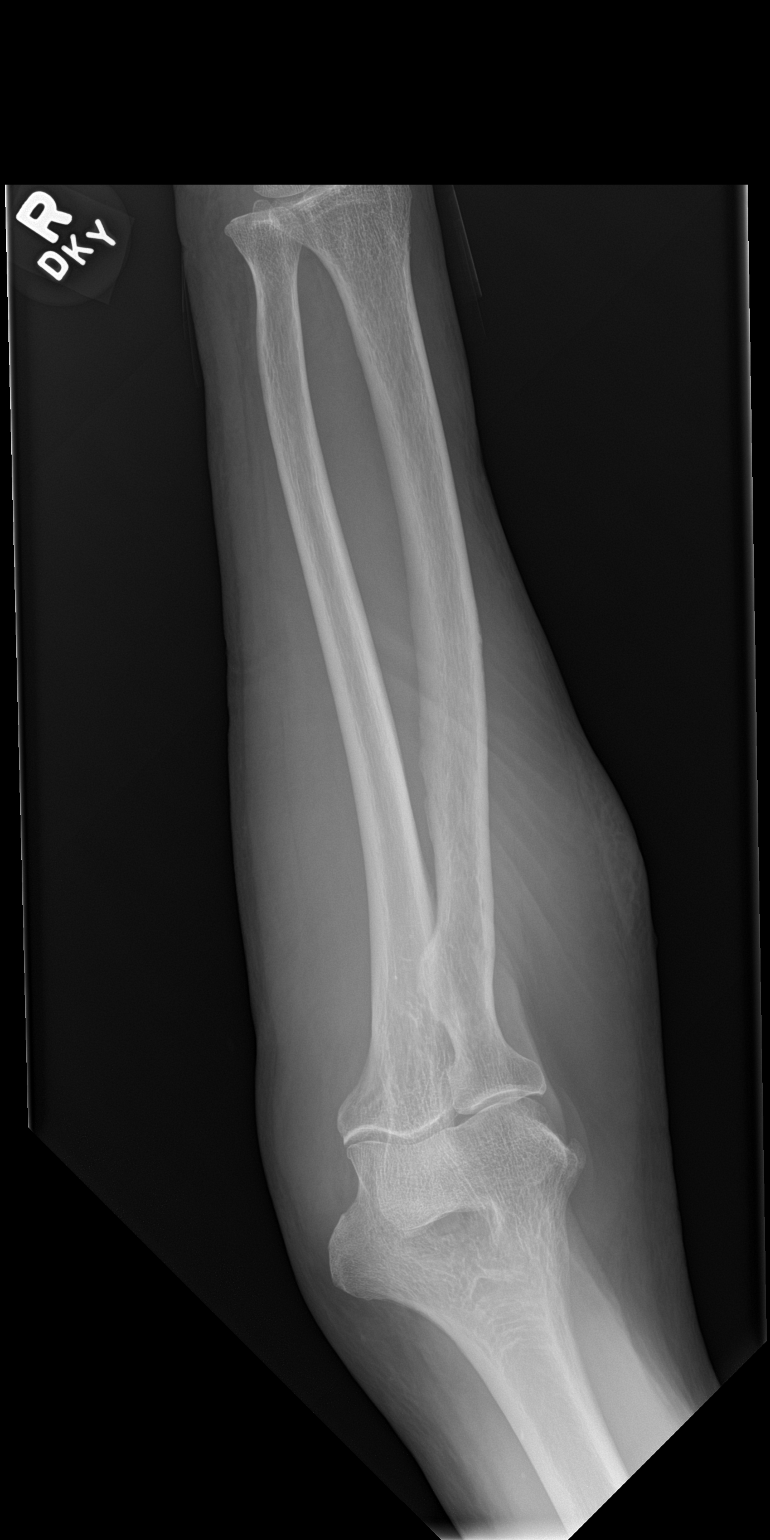

[forearm lat]
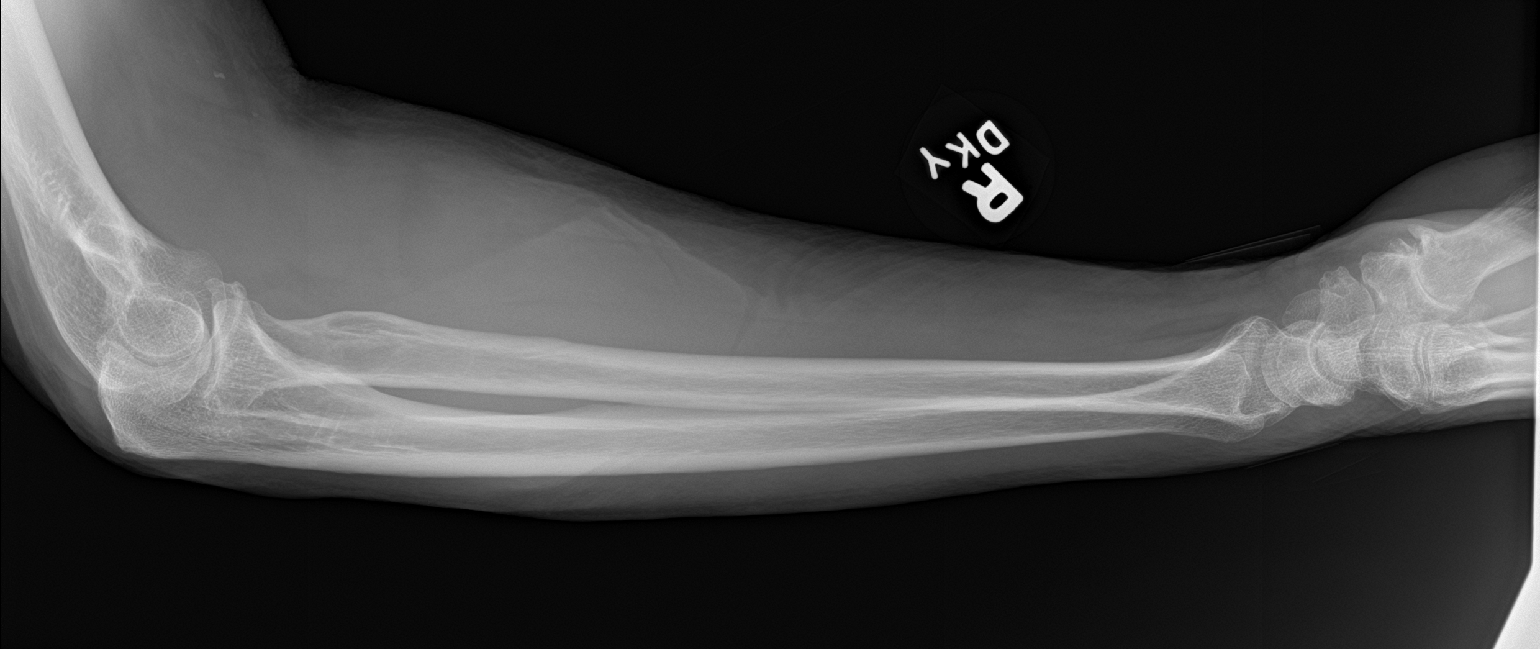

[2 of 2 positions shown; findings below may reference images not displayed]

FINDINGS: Frontal and lateral views of the right forearm are obtained. There
are no acute displaced fractures. Alignment of the right elbow and
wrist is anatomic. Mild soft tissue swelling lateral ventral aspect
of the proximal forearm.
IMPRESSION: 1. Proximal soft tissue swelling.  No acute fracture.

## 2022-09-24 IMAGING — CT CT CERVICAL SPINE W/O CM
3 series · 12 of 33 positions shown, 14 images · non-contrast
Comparison: None.

CLINICAL DATA: Fell, facial trauma

EXAM:
CT CERVICAL SPINE WITHOUT CONTRAST
TECHNIQUE: Multidetector CT imaging of the cervical spine was performed without
intravenous contrast. Multiplanar CT image reconstructions were also
generated.

[Series 5: c_spine 2.0 st · axial · 0.42mm/px · z∈[-200,-102]mm · 4 of 73 slices shown, 5 images]
[im 12/73  soft-tissue]
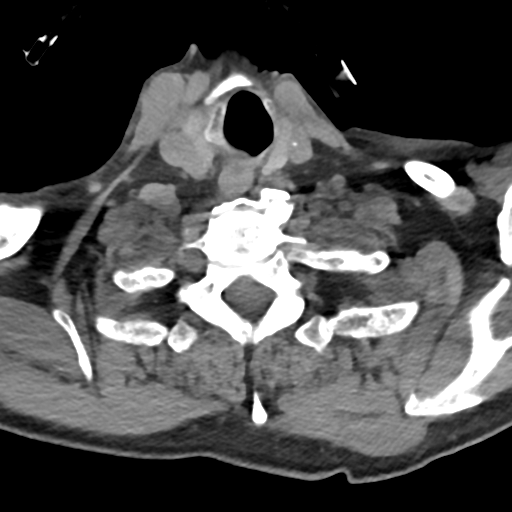
[im 12/73  bone]
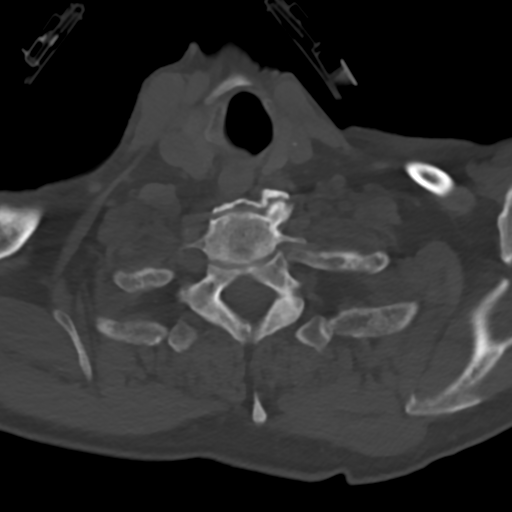
[im 28/73  bone]
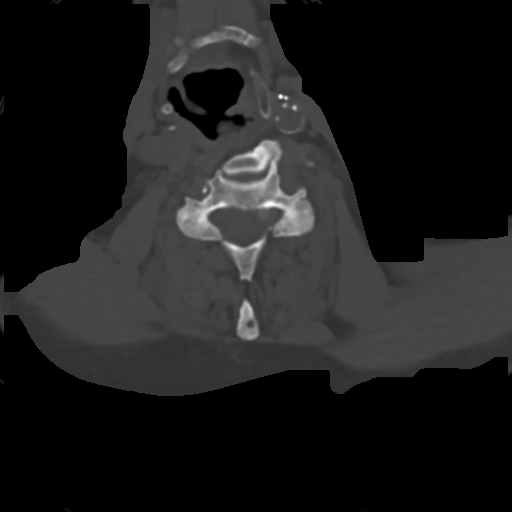
[im 45/73  bone]
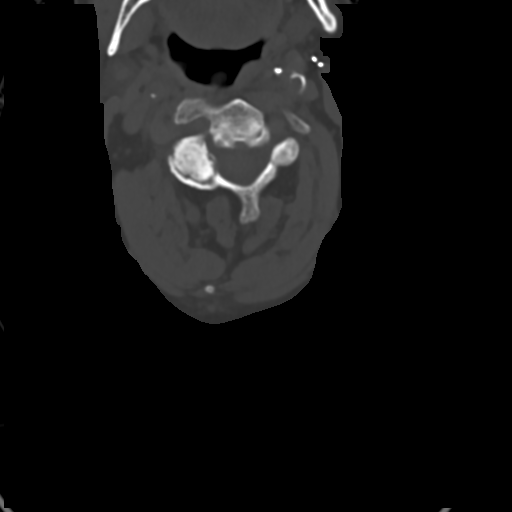
[im 61/73  bone]
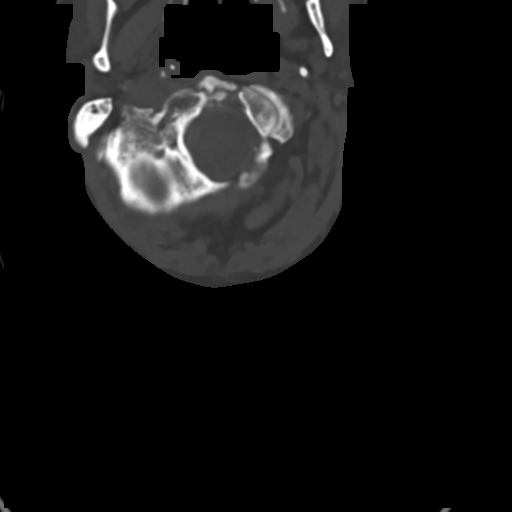

[Series 8: c_spine 2.0 sag bone · sagittal · 0.35mm/px · 5 of 77 slices shown, 6 images]
[im 26/77  bone]
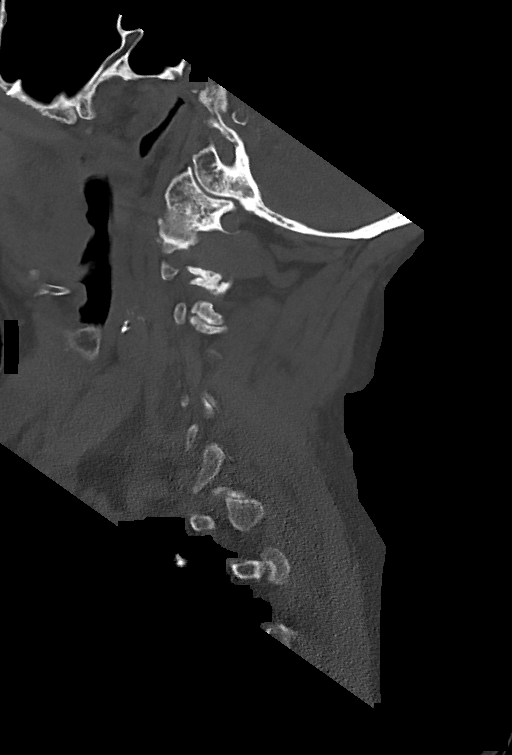
[im 32/77  bone]
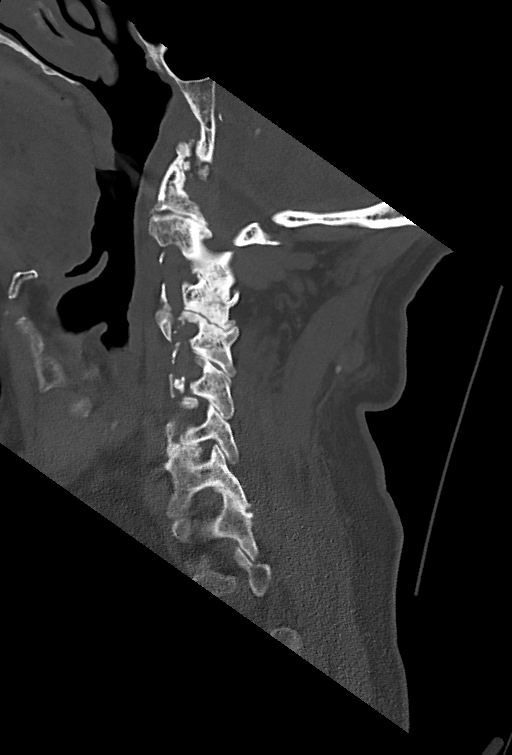
[im 39/77  soft-tissue]
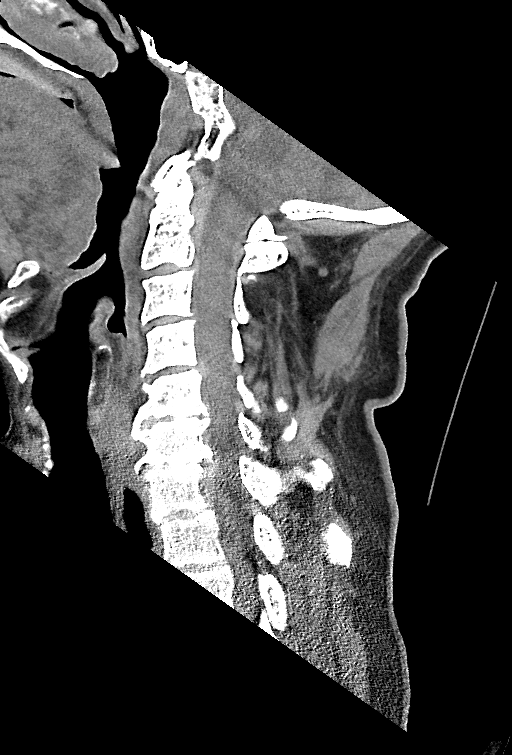
[im 39/77  bone]
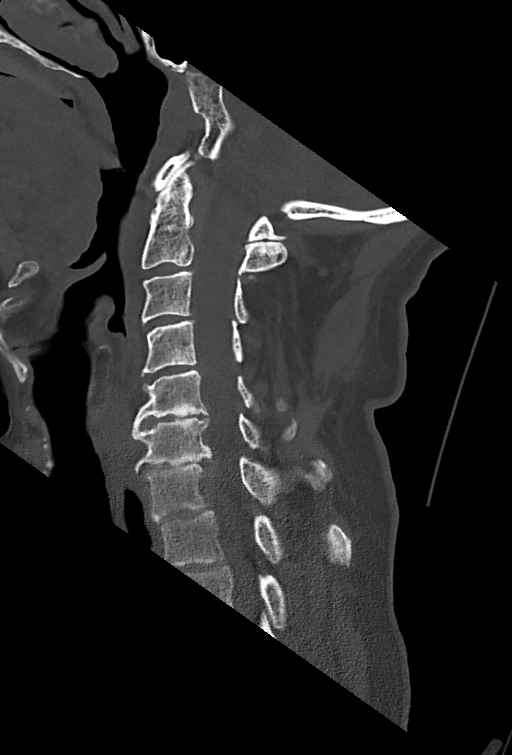
[im 45/77  bone]
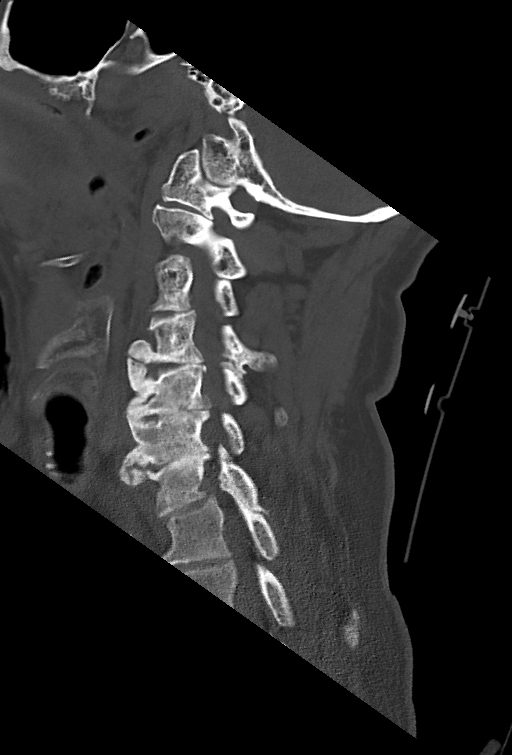
[im 51/77  bone]
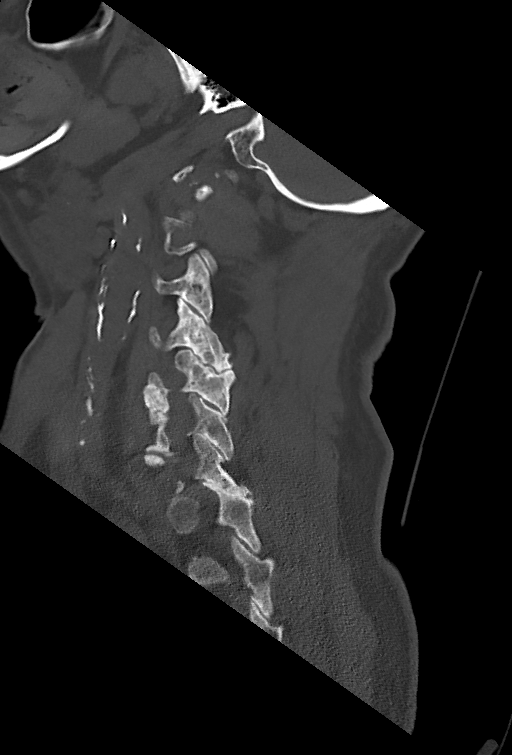

[Series 9: c_spine 2.0 cor bone · coronal · 0.30mm/px · 3 of 87 slices shown]
[im 31/87  bone]
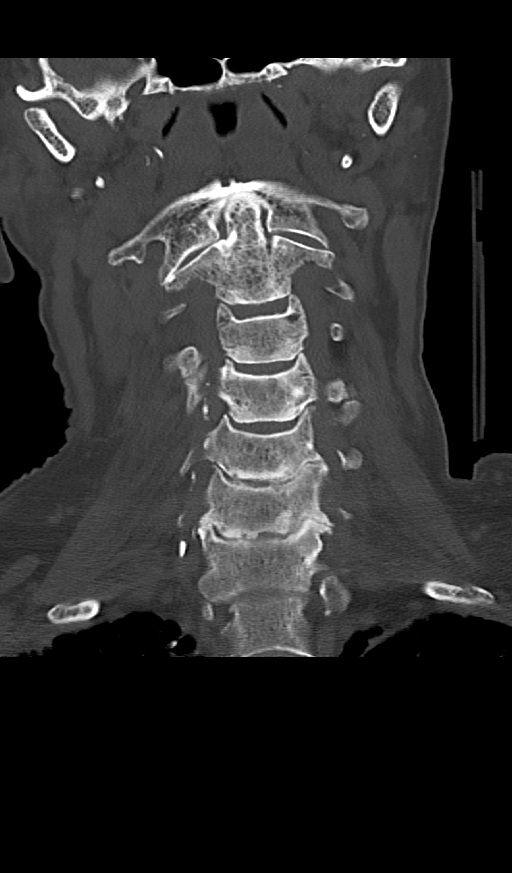
[im 39/87  bone]
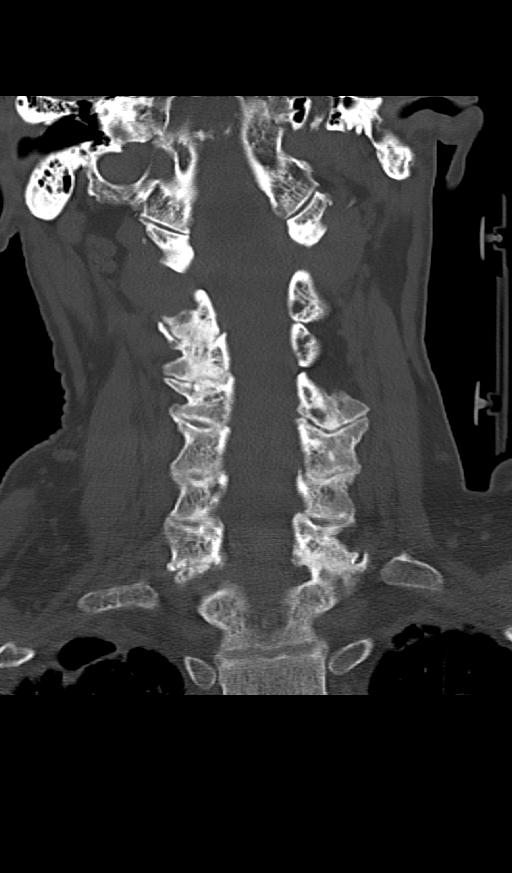
[im 48/87  bone]
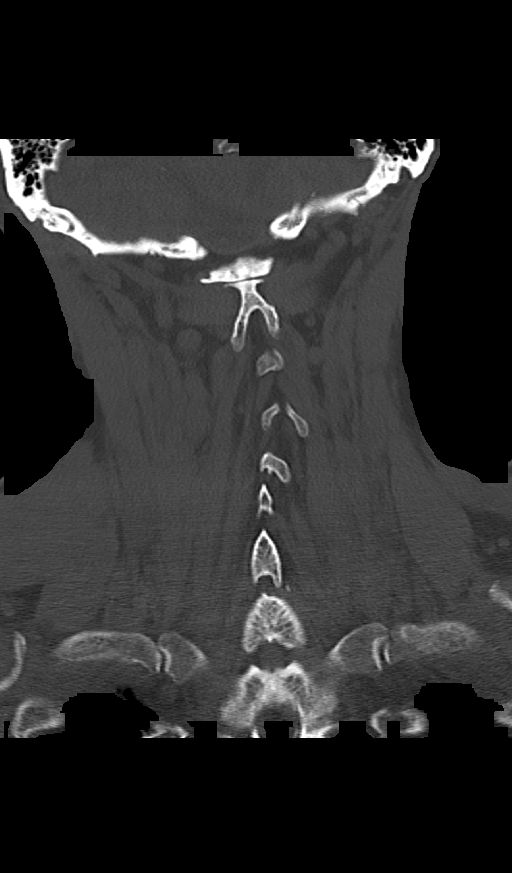

[12 of 33 positions shown; findings below may reference images not displayed]

FINDINGS: Alignment: Mild anterolisthesis of C7 on T1 likely due to extensive
facet hypertrophic changes. Otherwise alignment is anatomic.

Skull base and vertebrae: No acute fracture. No primary bone lesion
or focal pathologic process.

Soft tissues and spinal canal: No prevertebral fluid or swelling. No
visible canal hematoma. Extensive atherosclerosis of the carotid
arteries.

Disc levels: Prominent spondylosis at C5-6 and C6-7. There is
diffuse facet hypertrophic change. Bilateral neural foraminal
encroachment at C5-6 and C6-7, with right predominant neural
foraminal narrowing at C3-4.

Upper chest: Airways patent. Emphysematous changes and scarring are
noted at the lung apices.

Other: Reconstructed images demonstrate no additional findings.
IMPRESSION: 1. No acute cervical spine fracture.
2. Extensive multilevel cervical spondylosis and facet hypertrophy
as above.
